# Patient Record
Sex: Female | Born: 1975
Health system: Southern US, Community
[De-identification: ages and names within clinical notes are randomized; demographics above are authoritative.]

## PROBLEM LIST (undated history)

## (undated) DIAGNOSIS — Z789 Other specified health status: Secondary | ICD-10-CM

## (undated) HISTORY — DX: Other specified health status: Z78.9

---

## 2015-11-10 HISTORY — PX: ABLATION: SHX5711

## 2018-01-21 ENCOUNTER — Ambulatory Visit: Payer: Self-pay | Admitting: Family Medicine

## 2018-04-22 ENCOUNTER — Encounter: Payer: Self-pay | Admitting: Family Medicine

## 2018-04-22 ENCOUNTER — Ambulatory Visit (INDEPENDENT_AMBULATORY_CARE_PROVIDER_SITE_OTHER): Payer: 59 | Admitting: Family Medicine

## 2018-04-22 VITALS — BP 112/70 | HR 85 | Temp 98.5°F | Resp 12 | Ht 65.0 in | Wt 122.2 lb

## 2018-04-22 DIAGNOSIS — Z1231 Encounter for screening mammogram for malignant neoplasm of breast: Secondary | ICD-10-CM | POA: Diagnosis not present

## 2018-04-22 DIAGNOSIS — Z87898 Personal history of other specified conditions: Secondary | ICD-10-CM | POA: Diagnosis not present

## 2018-04-22 DIAGNOSIS — F17200 Nicotine dependence, unspecified, uncomplicated: Secondary | ICD-10-CM

## 2018-04-22 DIAGNOSIS — Z1239 Encounter for other screening for malignant neoplasm of breast: Secondary | ICD-10-CM

## 2018-04-22 DIAGNOSIS — Z8742 Personal history of other diseases of the female genital tract: Secondary | ICD-10-CM | POA: Insufficient documentation

## 2018-04-22 DIAGNOSIS — D2272 Melanocytic nevi of left lower limb, including hip: Secondary | ICD-10-CM

## 2018-04-22 NOTE — Patient Instructions (Addendum)
A few things to remember from today's visit:   Melanocytic nevus of left lower extremity - Plan: Ambulatory referral to Dermatology  Tobacco use disorder  Hx of abnormal cervical Pap smear  Breast cancer screening - Plan: MM 3D SCREEN BREAST BILATERAL  Please schedule appointment for your complete physical/gynecologic care. Try Chantix for smoking cessation. Mammogram was ordered today.   Please be sure medication list is accurate. If a new problem present, please set up appointment sooner than planned today.

## 2018-04-22 NOTE — Progress Notes (Signed)
HPI:   [name] is a 42 y.o. female, who is here today to establish care.  Former PCP: N/A.  She moved from [place] almost 2 years ago and has not established with PCP. Last preventive routine visit: 2017.  Chronic medical problems: Abnormal Pap smear in 2017 and tobacco use disorder.  She has prescription for Chantix at home but she is afraid of having side effects, so she has not started it. She has tried other different pharmacologic treatments, none has helped. She denies history of depression or anxiety.    Concerns today: "Mole", which she noted initially about a year ago and seems to be growing. She has more on left great toe and left thigh.  She has not noted pain, easy bleeding, or pruritus. Family history for skin cancer negative. She is not consistent with sunscreen use.  She also would like to have a mammogram scheduled. Maternal grandmother with history of breast cancer in her early 20s.    Review of Systems  Constitutional: Negative for activity change, appetite change, fatigue, fever and unexpected weight change.  HENT: Negative for mouth sores, nosebleeds and trouble swallowing.   Eyes: Negative for redness and visual disturbance.  Respiratory: Negative for cough, shortness of breath and wheezing.   Cardiovascular: Negative for chest pain, palpitations and leg swelling.  Gastrointestinal: Negative for abdominal pain, nausea and vomiting.       Negative for changes in bowel habits.  Genitourinary: Negative for decreased urine volume and hematuria.  Skin: Negative for color change and wound.  Neurological: Negative for syncope, weakness and headaches.  Psychiatric/Behavioral: Negative for sleep disturbance. The patient is not nervous/anxious.       No current outpatient medications on file prior to visit.   No current facility-administered medications on file prior to visit.      History reviewed. No pertinent past medical  history. No Known Allergies  Family History  Problem Relation Age of Onset  . Cancer Father   . Cancer Maternal Grandmother     Social History   Socioeconomic History  . Marital status: Married    Spouse name: Not on file  . Number of children: 4  . Years of education: Not on file  . Highest education level: Not on file  Occupational History  . Not on file  Social Needs  . Financial resource strain: Not on file  . Food insecurity:    Worry: Not on file    Inability: Not on file  . Transportation needs:    Medical: Not on file    Non-medical: Not on file  Tobacco Use  . Smoking status: Current Every Day Smoker    Types: Cigarettes  . Smokeless tobacco: Never Used  Substance and Sexual Activity  . Alcohol use: Not Currently  . Drug use: Not Currently  . Sexual activity: Yes  Lifestyle  . Physical activity:    Days per week: Not on file    Minutes per session: Not on file  . Stress: Not on file  Relationships  . Social connections:    Talks on phone: Not on file    Gets together: Not on file    Attends religious service: Not on file    Active member of club or organization: Not on file    Attends meetings of clubs or organizations: Not on file    Relationship status: Not on file  Other Topics Concern  . Not on file  Social History  Narrative  . Not on file    Vitals:   04/22/18 1032  BP: 112/70  Pulse: 85  Resp: 12  Temp: 98.5 F (36.9 C)  SpO2: 99%    Body mass index is 20.34 kg/m.   Physical Exam  Nursing note and vitals reviewed. Constitutional: She is oriented to person, place, and time. She appears well-developed and well-nourished. No distress.  HENT:  Head: Normocephalic and atraumatic.  Mouth/Throat: Oropharynx is clear and moist and mucous membranes are normal.  Eyes: Pupils are equal, round, and reactive to light. Conjunctivae are normal.  Cardiovascular: Normal rate and regular rhythm.  No murmur heard. Pulses:      Dorsalis pedis  pulses are 2+ on the right side, and 2+ on the left side.  Respiratory: Effort normal and breath sounds normal. No respiratory distress.  GI: Soft. She exhibits no mass. There is no hepatomegaly. There is no tenderness.  Musculoskeletal: She exhibits no edema.       Feet:  Lymphadenopathy:    She has no cervical adenopathy.  Neurological: She is alert and oriented to person, place, and time. She has normal strength. Coordination normal.  Skin: Skin is warm. No rash noted. No erythema.  1-2 mm hyperpigmented macular lesion left great toe.Even pigmentation and defined borders. Seen MS foot graphic.  Psychiatric: She has a normal mood and affect.  Well groomed, good eye contact.      ASSESSMENT AND PLAN:  [name] was seen today for establish care.  Diagnoses and all orders for this visit:  Melanocytic nevus of left lower extremity  Lesion doe snot seem suspicious. Because it is growing as well as her concern, dermatology appt will be arranged. Sun screen and protective clothes recommended.  -     Ambulatory referral to Dermatology  Tobacco use disorder  Adverse effects of tobacco discussed as well as benefits of smoking cessation discussed. We reviewed some side effects of Chantix, encouraged to try.  Hx of abnormal cervical Pap smear  S/P colpo Bx, reported as negative. She would like to have pap smear done here,so she will arrange appt.  Breast cancer screening -     MM 3D SCREEN BREAST BILATERAL; Future      Jessica Rivenburg G. Martinique, MD  [facility]. [place] office.

## 2018-04-25 ENCOUNTER — Encounter: Payer: Self-pay | Admitting: Family Medicine

## 2018-04-25 ENCOUNTER — Ambulatory Visit (INDEPENDENT_AMBULATORY_CARE_PROVIDER_SITE_OTHER): Payer: 59 | Admitting: Family Medicine

## 2018-04-25 ENCOUNTER — Other Ambulatory Visit (HOSPITAL_COMMUNITY)
Admission: RE | Admit: 2018-04-25 | Discharge: 2018-04-25 | Disposition: A | Discharge status: Home / Self Care | Payer: 59 | Source: Ambulatory Visit | Attending: Family Medicine | Admitting: Family Medicine

## 2018-04-25 VITALS — BP 120/60 | HR 67 | Temp 97.6°F | Resp 12 | Ht 65.0 in | Wt 122.4 lb

## 2018-04-25 DIAGNOSIS — Z13220 Encounter for screening for lipoid disorders: Secondary | ICD-10-CM | POA: Diagnosis present

## 2018-04-25 DIAGNOSIS — Z23 Encounter for immunization: Secondary | ICD-10-CM

## 2018-04-25 DIAGNOSIS — Z131 Encounter for screening for diabetes mellitus: Secondary | ICD-10-CM

## 2018-04-25 DIAGNOSIS — Z124 Encounter for screening for malignant neoplasm of cervix: Secondary | ICD-10-CM

## 2018-04-25 DIAGNOSIS — Z0000 Encounter for general adult medical examination without abnormal findings: Secondary | ICD-10-CM | POA: Diagnosis not present

## 2018-04-25 LAB — BASIC METABOLIC PANEL
BUN: 13 mg/dL (ref 6–23)
CALCIUM: 9.3 mg/dL (ref 8.4–10.5)
CO2: 29 meq/L (ref 19–32)
CREATININE: 0.62 mg/dL (ref 0.40–1.20)
Chloride: 103 mEq/L (ref 96–112)
GFR: 113.34 mL/min (ref >60.00)
GLUCOSE: 87 mg/dL (ref 70–99)
Potassium: 4.5 mEq/L (ref 3.5–5.1)
Sodium: 138 mEq/L (ref 135–145)

## 2018-04-25 LAB — LIPID PANEL
CHOLESTEROL: 146 mg/dL (ref 0–200)
HDL: 61 mg/dL (ref >39.00)
LDL Cholesterol: 74 mg/dL (ref 0–99)
NONHDL: 85.45
Total CHOL/HDL Ratio: 2
Triglycerides: 56 mg/dL (ref 0.0–149.0)
VLDL: 11.2 mg/dL (ref 0.0–40.0)

## 2018-04-25 NOTE — Progress Notes (Signed)
HPI:   Jessica Singh is a 42 y.o. female, who is here today for her routine physical.  I saw her last on 04/22/18 to establish care. Last CPE: 2017  Regular exercise 3 or more time per week: Not consistently. Following a healthy diet: Yes She lives with her husband and 4 children.  Chronic medical problems: Tobacco use disorder.  Pap smear in 2017,reported as normal.  Hx of abnormal pap smears: She has had conization and LEEP. Last abnormal one not sure if over 10 years. Hx of STD's No   There is no immunization history on file for this patient.  Mammogram: N/A Colonoscopy: N/A DEXA: N/A  G:6 L:4 LMP 2017, S/p endometrial ablation.  S/P BTL.   Review of Systems  Constitutional: Negative for appetite change, fatigue, fever and unexpected weight change.  HENT: Negative for dental problem, hearing loss, nosebleeds, trouble swallowing and voice change.   Eyes: Negative for redness and visual disturbance.  Respiratory: Negative for cough, shortness of breath and wheezing.   Cardiovascular: Negative for chest pain and leg swelling.  Gastrointestinal: Negative for abdominal pain, blood in stool, nausea and vomiting.       No changes in bowel habits.  Endocrine: Negative for cold intolerance, heat intolerance, polydipsia, polyphagia and polyuria.  Genitourinary: Negative for decreased urine volume, dysuria, hematuria, menstrual problem, vaginal bleeding and vaginal discharge.       No breast tenderness or nipple discharge.  Musculoskeletal: Negative for arthralgias and back pain.  Skin: Negative for rash and wound.  Neurological: Negative for syncope, weakness, numbness and headaches.  Hematological: Negative for adenopathy. Does not bruise/bleed easily.  Psychiatric/Behavioral: Negative for confusion and sleep disturbance. The patient is not nervous/anxious.   All other systems reviewed and are negative.     No current outpatient medications on file  prior to visit.   No current facility-administered medications on file prior to visit.      History reviewed. No pertinent past medical history.  Past Surgical History:  Procedure Laterality Date  . ABLATION  2017  . CESAREAN SECTION  07/16/96, 11/19/11, 12/24/12    No Known Allergies  Family History  Problem Relation Age of Onset  . Cancer Father   . Cancer Maternal Grandmother     Social History   Socioeconomic History  . Marital status: Married    Spouse name: Not on file  . Number of children: 4  . Years of education: Not on file  . Highest education level: Not on file  Occupational History  . Not on file  Social Needs  . Financial resource strain: Not on file  . Food insecurity:    Worry: Not on file    Inability: Not on file  . Transportation needs:    Medical: Not on file    Non-medical: Not on file  Tobacco Use  . Smoking status: Current Every Day Smoker    Types: Cigarettes  . Smokeless tobacco: Never Used  Substance and Sexual Activity  . Alcohol use: Not Currently  . Drug use: Not Currently  . Sexual activity: Yes  Lifestyle  . Physical activity:    Days per week: Not on file    Minutes per session: Not on file  . Stress: Not on file  Relationships  . Social connections:    Talks on phone: Not on file    Gets together: Not on file    Attends religious service: Not on file    Active  member of club or organization: Not on file    Attends meetings of clubs or organizations: Not on file    Relationship status: Not on file  Other Topics Concern  . Not on file  Social History Narrative  . Not on file     Vitals:   04/25/18 0843  BP: 120/60  Pulse: 67  Resp: 12  Temp: 97.6 F (36.4 C)  SpO2: 97%   Body mass index is 20.36 kg/m.   Wt Readings from Last 3 Encounters:  04/25/18 122 lb 6 oz (55.5 kg)  04/22/18 122 lb 4 oz (55.5 kg)     Physical Exam  Nursing note and vitals reviewed. Constitutional: She is oriented to person, place,  and time. She appears well-developed and well-nourished. No distress.  HENT:  Head: Normocephalic and atraumatic.  Right Ear: Hearing, tympanic membrane, external ear and ear canal normal.  Left Ear: Hearing, tympanic membrane, external ear and ear canal normal.  Mouth/Throat: Uvula is midline, oropharynx is clear and moist and mucous membranes are normal.  Eyes: Pupils are equal, round, and reactive to light. Conjunctivae and EOM are normal.  Neck: No tracheal deviation present. No thyromegaly present.  Cardiovascular: Normal rate and regular rhythm.  No murmur heard. Pulses:      Dorsalis pedis pulses are 2+ on the right side, and 2+ on the left side.  Respiratory: Effort normal and breath sounds normal. No respiratory distress. No breast swelling or tenderness.  GI: Soft. She exhibits no mass. There is no hepatomegaly. There is no tenderness.  Genitourinary: There is no rash, tenderness or lesion on the right labia. There is no rash, tenderness or lesion on the left labia. Uterus is not enlarged and not tender. Cervix exhibits discharge. Cervix exhibits no motion tenderness and no friability. Right adnexum displays no mass, no tenderness and no fullness. Left adnexum displays no mass, no tenderness and no fullness. No erythema or bleeding in the vagina. Vaginal discharge found.  Genitourinary Comments: Breast: Fibrocystic changes outer quadrants,bilateral. No nipple discharge or skin abnormalities bilaterally.  Pap smear collected.  Musculoskeletal: She exhibits no edema.  No major deformity or sing of synovitis appreciated.  Lymphadenopathy:    She has no cervical adenopathy.       Right: No inguinal and no supraclavicular adenopathy present.       Left: No inguinal and no supraclavicular adenopathy present.  Neurological: She is alert and oriented to person, place, and time. She has normal strength. No cranial nerve deficit. Coordination and gait normal.  Reflex Scores:      Bicep  reflexes are 2+ on the right side and 2+ on the left side.      Patellar reflexes are 2+ on the right side and 2+ on the left side. Skin: Skin is warm. No rash noted. There is erythema.  Skin erythema on face and shoulders, sunburn.  Psychiatric: She has a normal mood and affect. Her speech is normal.  Well groomed, good eye contact.      ASSESSMENT AND PLAN:  Ms. Trameka Dorough was here today annual physical examination.   Orders Placed This Encounter  Procedures  . Lipid panel  . Basic metabolic panel    Lab Results  Component Value Date   CREATININE 0.62 04/25/2018   BUN 13 04/25/2018   NA 138 04/25/2018   K 4.5 04/25/2018   CL 103 04/25/2018   CO2 29 04/25/2018   Lab Results  Component Value Date   CHOL  146 04/25/2018   HDL 61.00 04/25/2018   LDLCALC 74 04/25/2018   TRIG 56.0 04/25/2018   CHOLHDL 2 04/25/2018    Routine general medical examination at a health care facility  We discussed the importance of regular physical activity and healthy diet for prevention of chronic illness and/or complications. Preventive guidelines reviewed. Vaccination updated. Mammogram pending.Pap smear collected. Smoking cessation encouraged. Strongly recommend avoidance of direct UV exposure, bid hat as well as sunscreen. Next CPE in a year.   Cervical cancer screening -     Cytology - PAP ([place])  Screening for lipid disorders -     Lipid panel  Diabetes mellitus screening -     Basic metabolic panel    Return in 1 year (on 04/26/2019) for CPE .      Tadeo Besecker G. Martinique, MD  [facility]. [place] office.

## 2018-04-25 NOTE — Patient Instructions (Addendum)
A few things to remember from today's visit:   Routine general medical examination at a health care facility  Cervical cancer screening - Plan: Cytology - PAP ([place])  Screening for lipid disorders - Plan: Lipid panel  Diabetes mellitus screening - Plan: Basic metabolic panel  Today you have you routine preventive visit.  At least 150 minutes of moderate exercise per week, daily brisk walking for 15-30 min is a good exercise option. Healthy diet low in saturated (animal) fats and sweets and consisting of fresh fruits and vegetables, lean meats such as fish and white chicken and whole grains.  These are some of recommendations for screening depending of age and risk factors:   - Vaccines:   Tdap vaccine every 10 years. Given today.  Shingles vaccine recommended at age 57, could be given after 40 years of age but not sure about insurance coverage.   Pneumonia vaccines:  Prevnar 13 at 65 and Pneumovax at 45. Sometimes Pneumovax is giving earlier if history of smoking, lung disease,diabetes,kidney disease among some.    Screening for diabetes at age 2 and every 3 years.  Cervical cancer prevention:  Pap smear starts at 40 years of age and continues periodically until 40 years old in low risk women. Pap smear every 3 years between 63 and 39 years old. Pap smear every 3-5 years between women 48 and older if pap smear negative and HPV screening negative.   -Breast cancer: Mammogram: There is disagreement between experts about when to start screening in low risk asymptomatic female but recent recommendations are to start screening at 73 and not later than 40 years old , every 1-2 years and after 40 yo q 2 years. Screening is recommended until 40 years old but some women can continue screening depending of healthy issues.   Colon cancer screening: starts at 40 years old until 40 years old.  Cholesterol disorder screening at age 59 and every 3 years.  Also  recommended:  1. Dental visit- Brush and floss your teeth twice daily; visit your dentist twice a year. 2. Eye doctor- Get an eye exam at least every 2 years. 3. Helmet use- Always wear a helmet when riding a bicycle, motorcycle, rollerblading or skateboarding. 4. Safe sex- If you may be exposed to sexually transmitted infections, use a condom. 5. Seat belts- Seat belts can save your live; always wear one. 6. Smoke/Carbon Monoxide detectors- These detectors need to be installed on the appropriate level of your home. Replace batteries at least once a year. 7. Skin cancer- When out in the sun please cover up and use sunscreen 15 SPF or higher. 8. Violence- If anyone is threatening or hurting you, please tell your healthcare provider.  9. Drink alcohol in moderation- Limit alcohol intake to one drink or less per day. Never drink and drive.  Please be sure medication list is accurate. If a new problem present, please set up appointment sooner than planned today.

## 2018-04-27 LAB — CYTOLOGY - PAP
DIAGNOSIS: NEGATIVE
HPV (WINDOPATH): NOT DETECTED

## 2018-06-06 ENCOUNTER — Ambulatory Visit
Admission: RE | Admit: 2018-06-06 | Discharge: 2018-06-06 | Disposition: A | Discharge status: Home / Self Care | Payer: 59 | Source: Ambulatory Visit | Attending: Family Medicine | Admitting: Family Medicine

## 2018-06-06 DIAGNOSIS — Z1231 Encounter for screening mammogram for malignant neoplasm of breast: Secondary | ICD-10-CM | POA: Diagnosis not present

## 2018-06-06 DIAGNOSIS — Z1239 Encounter for other screening for malignant neoplasm of breast: Secondary | ICD-10-CM

## 2018-06-20 ENCOUNTER — Telehealth: Payer: Self-pay | Admitting: Family Medicine

## 2018-06-20 NOTE — Telephone Encounter (Signed)
Spoke with patient and she that even though her imaging was normal, she wanted to know if she could get more testing done, maybe a sonogram because of the density in her breast and the fact that her grandmother had bilateral breast cancer and she had two aunts that passed away from breast cancer. Please advise.

## 2018-06-20 NOTE — Telephone Encounter (Signed)
Copied from [place] [id number]. Topic: Quick Communication - Other Results >> Jun 20, 2018 12:42 PM Hewitt Shorts wrote: Pt has been told her mammogram results but she has more questions as to what the next step needs to be   Best number     [phone]

## 2018-06-20 NOTE — Telephone Encounter (Signed)
Sonogram is usually performed when mammogram is abnormal and planning on performing guide biopsy.  Given the fact she had a completely normal mammogram, Bi-Rads 1, further imaging is not recommended nor appropriate at this time.  Dense breast alone is not an indication for sonogram or for repeating mammograms more often.  If she has further questions in the regard we can arrange appointment for further discussion. If she wants a second opinion she could arrange appointment with gynecologist.  Thanks, BJ

## 2018-06-21 NOTE — Telephone Encounter (Signed)
Spoke with patient, gave instructions per Dr. Jordan. Patient verbalized understanding. 

## 2018-07-21 DIAGNOSIS — L814 Other melanin hyperpigmentation: Secondary | ICD-10-CM | POA: Diagnosis not present

## 2018-07-21 DIAGNOSIS — D225 Melanocytic nevi of trunk: Secondary | ICD-10-CM | POA: Diagnosis not present

## 2018-07-21 DIAGNOSIS — D1801 Hemangioma of skin and subcutaneous tissue: Secondary | ICD-10-CM | POA: Diagnosis not present

## 2018-09-19 ENCOUNTER — Telehealth: Payer: Self-pay | Admitting: Family Medicine

## 2018-09-19 NOTE — Telephone Encounter (Signed)
Message sent to Dr. Jordan for review and approval. 

## 2018-09-19 NOTE — Telephone Encounter (Signed)
Copied from [place] [id number]. Topic: Quick Communication - See Telephone Encounter >> Sep 19, 2018 12:23 PM Antonieta Iba C wrote: CRM for notification. See Telephone encounter for: 09/19/18.  Pt is requesting a Rx for Mupirocin for impetigo. Pt says that she use to take medication for concern with her previous provider. Pt says that she is new to Dr. Martinique but it should be in her med records.    Pharmacy: [facility] [address] [place] - [place] [address] [phone] (Phone) [phone] (Fax)    CB: [phone]

## 2018-09-23 NOTE — Telephone Encounter (Signed)
She can use topical OTC antibiotic and follow if needed. Thanks, BJ

## 2018-09-23 NOTE — Telephone Encounter (Signed)
Patient given instructions per Dr. Martinique. Patient verbalized understanding.

## 2019-04-28 ENCOUNTER — Ambulatory Visit (INDEPENDENT_AMBULATORY_CARE_PROVIDER_SITE_OTHER): Payer: 59 | Admitting: Family Medicine

## 2019-04-28 ENCOUNTER — Other Ambulatory Visit: Payer: Self-pay

## 2019-04-28 ENCOUNTER — Encounter: Payer: Self-pay | Admitting: Family Medicine

## 2019-04-28 VITALS — BP 120/58 | HR 90 | Temp 97.6°F | Resp 12 | Ht 65.0 in | Wt 120.6 lb

## 2019-04-28 DIAGNOSIS — Z13220 Encounter for screening for lipoid disorders: Secondary | ICD-10-CM | POA: Diagnosis not present

## 2019-04-28 DIAGNOSIS — Z1329 Encounter for screening for other suspected endocrine disorder: Secondary | ICD-10-CM

## 2019-04-28 DIAGNOSIS — Z0000 Encounter for general adult medical examination without abnormal findings: Secondary | ICD-10-CM

## 2019-04-28 DIAGNOSIS — Z0189 Encounter for other specified special examinations: Secondary | ICD-10-CM | POA: Diagnosis not present

## 2019-04-28 DIAGNOSIS — Z13228 Encounter for screening for other metabolic disorders: Secondary | ICD-10-CM | POA: Diagnosis not present

## 2019-04-28 DIAGNOSIS — Z130 Encounter for screening for diseases of the blood and blood-forming organs and certain disorders involving the immune mechanism: Secondary | ICD-10-CM | POA: Diagnosis not present

## 2019-04-28 DIAGNOSIS — Z008 Encounter for other general examination: Secondary | ICD-10-CM

## 2019-04-28 DIAGNOSIS — N644 Mastodynia: Secondary | ICD-10-CM

## 2019-04-28 LAB — LIPID PANEL
Cholesterol: 138 mg/dL (ref 0–200)
HDL: 63.5 mg/dL (ref >39.00)
LDL Cholesterol: 64 mg/dL (ref 0–99)
NonHDL: 74.26
Total CHOL/HDL Ratio: 2
Triglycerides: 52 mg/dL (ref 0.0–149.0)
VLDL: 10.4 mg/dL (ref 0.0–40.0)

## 2019-04-28 LAB — BASIC METABOLIC PANEL
BUN: 13 mg/dL (ref 6–23)
CO2: 28 mEq/L (ref 19–32)
Calcium: 9.3 mg/dL (ref 8.4–10.5)
Chloride: 103 mEq/L (ref 96–112)
Creatinine, Ser: 0.59 mg/dL (ref 0.40–1.20)
GFR: 112.35 mL/min (ref >60.00)
Glucose, Bld: 78 mg/dL (ref 70–99)
Potassium: 4.2 mEq/L (ref 3.5–5.1)
Sodium: 138 mEq/L (ref 135–145)

## 2019-04-28 NOTE — Patient Instructions (Signed)
A few things to remember from today's visit:   Routine general medical examination at a health care facility  Screening for lipoid disorders - Plan: Lipid panel  Screening for endocrine, metabolic and immunity disorder - Plan: Basic metabolic panel Today you have you routine preventive visit.  At least 150 minutes of moderate exercise per week, daily brisk walking for 15-30 min is a good exercise option. Healthy diet low in saturated (animal) fats and sweets and consisting of fresh fruits and vegetables, lean meats such as fish and white chicken and whole grains.  These are some of recommendations for screening depending of age and risk factors:   - Vaccines:  Tdap vaccine every 10 years.  Shingles vaccine recommended at age 83, could be given after 41 years of age but not sure about insurance coverage.   Pneumonia vaccines:  Prevnar 13 at 65 and Pneumovax at 50. Sometimes Pneumovax is giving earlier if history of smoking, lung disease,diabetes,kidney disease among some.    Screening for diabetes at age 43 and every 3 years.  Cervical cancer prevention:  Pap smear starts at 41 years of age and continues periodically until 41 years old in low risk women. Pap smear every 3 years between 27 and 73 years old. Pap smear every 3-5 years between women 74 and older if pap smear negative and HPV screening negative.   -Breast cancer: Mammogram: There is disagreement between experts about when to start screening in low risk asymptomatic female but recent recommendations are to start screening at 90 and not later than 41 years old , every 1-2 years and after 41 yo q 2 years. Screening is recommended until 41 years old but some women can continue screening depending of healthy issues.   Colon cancer screening: starts at 41 years old until 41 years old.  Cholesterol disorder screening at age 33 and every 3 years.  Also recommended:  1. Dental visit- Brush and floss your teeth twice  daily; visit your dentist twice a year. 2. Eye doctor- Get an eye exam at least every 2 years. 3. Helmet use- Always wear a helmet when riding a bicycle, motorcycle, rollerblading or skateboarding. 4. Safe sex- If you may be exposed to sexually transmitted infections, use a condom. 5. Seat belts- Seat belts can save your live; always wear one. 6. Smoke/Carbon Monoxide detectors- These detectors need to be installed on the appropriate level of your home. Replace batteries at least once a year. 7. Skin cancer- When out in the sun please cover up and use sunscreen 15 SPF or higher. 8. Violence- If anyone is threatening or hurting you, please tell your healthcare provider.  9. Drink alcohol in moderation- Limit alcohol intake to one drink or less per day. Never drink and drive. Please be sure medication list is accurate. If a new problem present, please set up appointment sooner than planned today.

## 2019-04-28 NOTE — Progress Notes (Signed)
HPI:  Chief Complaint  Patient presents with  . Annual Exam    Jessica Singh is a 43 y.o. female, who is here today for her routine CPE. Last CPE:04/25/18.  She lives with her husband and their 4 children. Regular exercise:She walks a few times per week. Healthful diet:Yes  + Smoker.  Immunization History  Administered Date(s) Administered  . Tdap 04/25/2018    Last Pap smear:04/25/2018 negative,HPV negative. History of abnormal Pap smear:None in the past 10 years. History of STDs:Negative.  LMP:She had endometrial ablation in 2017.  G:6 L:4 Birth control:BTL  Mammogram: 06/06/2018 Birads 1. According to pt,after mammogram she was told she needed "a different test" because dense breast. She has bilateral breast tenderness,no nipple discharge. She has tenderness around the time she is supposed to have menses but this time seems to be lasting more than usual.  She needs labs done for insurance purposes.  Lab Results  Component Value Date   CHOL 146 04/25/2018   HDL 61.00 04/25/2018   LDLCALC 74 04/25/2018   TRIG 56.0 04/25/2018   CHOLHDL 2 04/25/2018     Review of Systems  Constitutional: Negative for appetite change, fatigue and fever.  HENT: Negative for dental problem, hearing loss, mouth sores, sore throat, trouble swallowing and voice change.   Eyes: Negative for redness and visual disturbance.  Respiratory: Negative for cough, shortness of breath and wheezing.   Cardiovascular: Negative for chest pain and leg swelling.  Gastrointestinal: Negative for abdominal pain, nausea and vomiting.       No changes in bowel habits.  Endocrine: Negative for cold intolerance, heat intolerance, polydipsia, polyphagia and polyuria.  Genitourinary: Negative for decreased urine volume, dysuria, hematuria, vaginal bleeding and vaginal discharge.  Musculoskeletal: Negative for arthralgias, gait problem and myalgias.  Skin: Negative for color change and rash.   Allergic/Immunologic: Negative for environmental allergies.  Neurological: Negative for syncope, weakness and headaches.  Hematological: Negative for adenopathy. Does not bruise/bleed easily.  Psychiatric/Behavioral: Negative for confusion and sleep disturbance. The patient is not nervous/anxious.   All other systems reviewed and are negative.    No current outpatient medications on file prior to visit.   No current facility-administered medications on file prior to visit.     Past Medical History:  Diagnosis Date  . Medical history non-contributory     No Known Allergies  Social History   Socioeconomic History  . Marital status: Married    Spouse name: Not on file  . Number of children: 4  . Years of education: Not on file  . Highest education level: Not on file  Occupational History  . Not on file  Social Needs  . Financial resource strain: Not on file  . Food insecurity    Worry: Not on file    Inability: Not on file  . Transportation needs    Medical: Not on file    Non-medical: Not on file  Tobacco Use  . Smoking status: Current Every Day Smoker    Types: Cigarettes  . Smokeless tobacco: Never Used  Substance and Sexual Activity  . Alcohol use: Not Currently  . Drug use: Not Currently  . Sexual activity: Yes  Lifestyle  . Physical activity    Days per week: Not on file    Minutes per session: Not on file  . Stress: Not on file  Relationships  . Social Herbalist on phone: Not on file    Gets together: Not  on file    Attends religious service: Not on file    Active member of club or organization: Not on file    Attends meetings of clubs or organizations: Not on file    Relationship status: Not on file  Other Topics Concern  . Not on file  Social History Narrative  . Not on file    Today's Vitals   04/28/19 0855  BP: (!) 120/58  Pulse: 90  Resp: 12  Temp: 97.6 F (36.4 C)  TempSrc: Oral  SpO2: 99%  Weight: 120 lb 9.6 oz (54.7 kg)     Body mass index is 20.07 kg/m.   Physical Exam  Nursing note and vitals reviewed. Constitutional: She is oriented to person, place, and time. She appears well-developed and well-nourished. No distress.  HENT:  Head: Normocephalic and atraumatic.  Right Ear: Hearing, tympanic membrane, external ear and ear canal normal.  Left Ear: Hearing, tympanic membrane, external ear and ear canal normal.  Mouth/Throat: Uvula is midline, oropharynx is clear and moist and mucous membranes are normal.  Eyes: Pupils are equal, round, and reactive to light. Conjunctivae and EOM are normal.  Neck: No tracheal deviation present. No thyromegaly present.  Cardiovascular: Normal rate and regular rhythm.  No murmur heard. Pulses:      Dorsalis pedis pulses are 2+ on the right side, and 2+ on the left side.  Respiratory: Effort normal and breath sounds normal. No respiratory distress.  GI: Soft. She exhibits no mass. There is no hepatomegaly. There is no tenderness.  Genitourinary: Breast: No masses, skin abnormalities, or nipple discharge appreciated bilateral. Fibrocystic like changes, mainly outer upper quadrants bilateral.Mild tenderness upon palpation. Musculoskeletal:        General: No edema.     Comments: No major deformity or signs of synovitis appreciated.  Lymphadenopathy:    She has no cervical or axillary adenopathy.       Right: No supraclavicular adenopathy present.       Left: No supraclavicular adenopathy present.  Neurological: She is alert and oriented to person, place, and time. She has normal strength. No cranial nerve deficit. Coordination and gait normal.  Reflex Scores:      Bicep reflexes are 2+ on the right side and 2+ on the left side.      Patellar reflexes are 2+ on the right side and 2+ on the left side. Skin: Skin is warm. No rash noted. No erythema.  Psychiatric: She is anxious. Well groomed, good eye contact.    ASSESSMENT AND PLAN:  Jessica Singh was seen today for  annual exam.  Diagnoses and all orders for this visit: Lab Results  Component Value Date   CHOL 138 04/28/2019   HDL 63.50 04/28/2019   LDLCALC 64 04/28/2019   TRIG 52.0 04/28/2019   CHOLHDL 2 04/28/2019   Lab Results  Component Value Date   CREATININE 0.59 04/28/2019   BUN 13 04/28/2019   NA 138 04/28/2019   K 4.2 04/28/2019   CL 103 04/28/2019   CO2 28 04/28/2019     Routine general medical examination at a health care facility We discussed the importance of regular physical activity and healthy diet for prevention of chronic illness and/or complications. Preventive guidelines reviewed. Vaccination up to date.  Next CPE in a year.  Screening for lipoid disorders -     Lipid panel  Screening for endocrine, metabolic and immunity disorder -     Basic metabolic panel  Encounter for biometric screening -  Basic metabolic panel -     Lipid panel  Breast tenderness in female We discussed possible etiologies,most likely related to fibrocystic breast disease. Upset when we discussed guideline recommendations in regard to screening. She wants a breast US,explianed that this is not used as screening test. Dx mammogram will be arranged.  -     MM DIAG BREAST TOMO BILATERAL; Future    Return in about 1 year (around 04/27/2020) for CPE.    Betty Martinique, MD [facility]. [place] office.

## 2019-04-29 ENCOUNTER — Encounter: Payer: Self-pay | Admitting: Family Medicine

## 2019-05-03 ENCOUNTER — Other Ambulatory Visit: Payer: Self-pay | Admitting: Family Medicine

## 2019-05-03 DIAGNOSIS — N644 Mastodynia: Secondary | ICD-10-CM

## 2019-06-08 ENCOUNTER — Ambulatory Visit
Admission: RE | Admit: 2019-06-08 | Discharge: 2019-06-08 | Disposition: A | Discharge status: Home / Self Care | Payer: 59 | Source: Ambulatory Visit | Attending: Family Medicine | Admitting: Family Medicine

## 2019-06-08 ENCOUNTER — Ambulatory Visit: Payer: 59

## 2019-06-08 ENCOUNTER — Other Ambulatory Visit: Payer: Self-pay

## 2019-06-08 ENCOUNTER — Other Ambulatory Visit: Payer: Self-pay | Admitting: Family Medicine

## 2019-06-08 DIAGNOSIS — Z1231 Encounter for screening mammogram for malignant neoplasm of breast: Secondary | ICD-10-CM

## 2019-06-08 DIAGNOSIS — N644 Mastodynia: Secondary | ICD-10-CM

## 2020-02-29 ENCOUNTER — Telehealth: Payer: Self-pay | Admitting: Family Medicine

## 2020-02-29 NOTE — Telephone Encounter (Signed)
Pt is requesting prescription for a cream for impetigo.

## 2020-03-01 NOTE — Telephone Encounter (Signed)
Tried calling patient. Unable to leave message

## 2020-03-01 NOTE — Telephone Encounter (Signed)
If she has "impetigo" she needs to be evaluated in the office. Thanks, BJ

## 2020-03-01 NOTE — Telephone Encounter (Signed)
Please advise 

## 2020-03-04 NOTE — Telephone Encounter (Signed)
Spoke with patient and she stated that it was almost gone, declined office visit.

## 2020-04-26 ENCOUNTER — Other Ambulatory Visit: Payer: Self-pay

## 2020-04-28 NOTE — Progress Notes (Signed)
HPI: [name] is a 44 y.o. female, who is here today for her routine physical.  Last CPE: 04/28/19  Regular exercise 3 or more time per week: Yes, walks 1-2 hours daily. Following a healthful diet: Yes. She lives with her husband and 4 children.  Chronic medical problems: Tobacco use disorder. She is planning on trying Nicotine lozenges.  Pap smear: 04/25/18 negative for malignancy and for HPV. Hx of abnormal pap smears: None in the past 10 years.  Hx of STD's Negative. G:6 L:4. S/P BTL. LMP: S/P endometrial ablation in 2017.  Immunization History  Administered Date(s) Administered  . Tdap 04/25/2018   Mammogram: 06/08/19 Birads 1. Colonoscopy: N/A DEXA: N/A  Hep C screening: Never.  She has no concerns today. She would like to have labs today,including CBC for insurance purposes.  Review of Systems  Constitutional: Negative for appetite change and fever.  HENT: Negative for dental problem, hearing loss, mouth sores and sore throat.   Eyes: Negative for redness and visual disturbance.  Respiratory: Negative for cough, shortness of breath and wheezing.   Cardiovascular: Negative for chest pain, palpitations and leg swelling.  Gastrointestinal: Negative for abdominal pain, nausea and vomiting.       No changes in bowel habits.  Endocrine: Negative for cold intolerance, heat intolerance, polydipsia, polyphagia and polyuria.  Genitourinary: Negative for decreased urine volume, dysuria, hematuria, vaginal bleeding and vaginal discharge.  Musculoskeletal: Negative for gait problem and myalgias.  Skin: Negative for color change and rash.  Allergic/Immunologic: Negative for environmental allergies.  Neurological: Negative for syncope, weakness and headaches.  Hematological: Negative for adenopathy. Does not bruise/bleed easily.  Psychiatric/Behavioral: Negative for confusion and sleep disturbance. The patient is nervous/anxious.   All other systems  reviewed and are negative.  Current Outpatient Medications on File Prior to Visit  Medication Sig Dispense Refill  . ampicillin (PRINCIPEN) 500 MG capsule TAKE CAPSULE BY MOUTH ONCE DAILY WITH FOOD     No current facility-administered medications on file prior to visit.    Past Medical History:  Diagnosis Date  . Medical history non-contributory     Past Surgical History:  Procedure Laterality Date  . ABLATION  2017  . CESAREAN SECTION  07/16/96, 11/19/11, 12/24/12    No Known Allergies  Family History  Problem Relation Age of Onset  . Cancer Father   . Cancer Maternal Grandmother   . Breast cancer Maternal Grandmother 79    Social History   Socioeconomic History  . Marital status: Married    Spouse name: Not on file  . Number of children: 4  . Years of education: Not on file  . Highest education level: Not on file  Occupational History  . Not on file  Tobacco Use  . Smoking status: Current Every Day Smoker    Types: Cigarettes  . Smokeless tobacco: Never Used  Vaping Use  . Vaping Use: Never used  Substance and Sexual Activity  . Alcohol use: Not Currently  . Drug use: Not Currently  . Sexual activity: Yes  Other Topics Concern  . Not on file  Social History Narrative  . Not on file   Social Determinants of Health   Financial Resource Strain:   . Difficulty of Paying Living Expenses:   Food Insecurity:   . Worried About Charity fundraiser in the Last Year:   . Arboriculturist in the Last Year:   Transportation Needs:   . Lack of Transportation (  Medical):   Marland Kitchen Lack of Transportation (Non-Medical):   Physical Activity:   . Days of Exercise per Week:   . Minutes of Exercise per Session:   Stress:   . Feeling of Stress :   Social Connections:   . Frequency of Communication with Friends and Family:   . Frequency of Social Gatherings with Friends and Family:   . Attends Religious Services:   . Active Member of Clubs or Organizations:   . Attends English as a second language teacher Meetings:   Marland Kitchen Marital Status:    Vitals:   04/29/20 0755  BP: 110/60  Pulse: 81  Resp: 12  Temp: 98.3 F (36.8 C)  SpO2: 99%   Body mass index is 19.4 kg/m.  Wt Readings from Last 3 Encounters:  04/29/20 120 lb 3.2 oz (54.5 kg)  04/28/19 120 lb 9.6 oz (54.7 kg)  04/25/18 122 lb 6 oz (55.5 kg)   Physical Exam  Nursing note and vitals reviewed. Constitutional: She is oriented to person, place, and time. She appears well-developed. No distress.  HENT:  Head: Normocephalic and atraumatic.  Right Ear: Hearing, tympanic membrane, external ear and ear canal normal.  Left Ear: Hearing, tympanic membrane, external ear and ear canal normal.  Mouth/Throat: Uvula is midline. Mucous membranes are moist. Oropharynx is clear.  Eyes: Pupils are equal, round, and reactive to light. Conjunctivae are normal.  Neck: No tracheal deviation present. No thyromegaly present.  Cardiovascular: Normal rate and regular rhythm.  No murmur heard. Pulses:      Dorsalis pedis pulses are 2+ on the right side and 2+ on the left side.  Respiratory: Effort normal and breath sounds normal. No respiratory distress.  GI: Soft. Normal appearance. She exhibits no mass. There is no hepatomegaly. There is no abdominal tenderness.  Genitourinary:    Genitourinary Comments: Breast: Fibrocystic like changes outer upper quadrants, L>R.No masses,skin changes,or nipple discharge.   Musculoskeletal:     Comments: No major deformity or signs of synovitis appreciated.  Lymphadenopathy:    She has no cervical adenopathy.       Right: No supraclavicular adenopathy present.       Left: No supraclavicular adenopathy present.  Neurological: She is alert and oriented to person, place, and time. No cranial nerve deficit. Coordination and gait normal.  Reflex Scores:      Bicep reflexes are 2+ on the right side and 2+ on the left side.      Patellar reflexes are 2+ on the right side and 2+ on the left side. Skin:  Skin is warm. No rash noted. No erythema.  Psychiatric: Her speech is normal. Affect normal. Her mood appears anxious.  Well groomed, good eye contact.   ASSESSMENT AND PLAN:  Ms. Jessica Singh was here today annual physical examination.  Orders Placed This Encounter  Procedures  . CBC with Differential  . Lipid panel  . Hepatitis C antibody screen  . Comprehensive metabolic panel   Lab Results  Component Value Date   CREATININE 0.60 04/29/2020   BUN 10 04/29/2020   NA 137 04/29/2020   K 3.9 04/29/2020   CL 102 04/29/2020   CO2 30 04/29/2020   Lab Results  Component Value Date   ALT 14 04/29/2020   AST 16 04/29/2020   ALKPHOS 45 04/29/2020   BILITOT 1.0 04/29/2020   Lab Results  Component Value Date   WBC 5.4 04/29/2020   HGB 14.1 04/29/2020   HCT 41.2 04/29/2020   MCV 93.0 04/29/2020  PLT 240.0 04/29/2020   Lab Results  Component Value Date   CHOL 154 04/29/2020   HDL 63.30 04/29/2020   LDLCALC 77 04/29/2020   TRIG 68.0 04/29/2020   CHOLHDL 2 04/29/2020   Routine general medical examination at a health care facility We discussed the importance of regular physical activity and healthy diet for prevention of chronic illness and/or complications. Preventive guidelines reviewed. Mammogram appt already arranged. Pap smear in 2024. Vaccination up to date. Smoking cessation strongly recommended, planning on trying nicotine lozenges. Next CPE in a year.  Encounter for HCV screening test for low risk patient -     Hepatitis C antibody screen  Screening for deficiency anemia -     CBC with Differential  Screening for lipoid disorders -     Lipid panel  Screening for endocrine, metabolic and immunity disorder -     Comprehensive metabolic panel   Return in 1 year (on 04/29/2021) for cpe.   [place] Freyre G. Martinique, MD  [facility]. [place] office.   Health Maintenance Due  Topic Date Due  . Hepatitis C Screening  Never done  . COVID-19  Vaccine (1) Never done  . HIV Screening  Never done    Depression screen [facility] 2/9 04/29/2020 04/25/2018  Decreased Interest 0 0  Down, Depressed, Hopeless 0 0  PHQ - 2 Score 0 0   Today you have you routine preventive visit.  At least 150 minutes of moderate exercise per week, daily brisk walking for 15-30 min is a good exercise option. Healthy diet low in saturated (animal) fats and sweets and consisting of fresh fruits and vegetables, lean meats such as fish and white chicken and whole grains.  These are some of recommendations for screening depending of age and risk factors:  - Vaccines:  Tdap vaccine every 10 years.  Shingles vaccine recommended at age 30, could be given after 42 years of age but not sure about insurance coverage.   Pneumonia vaccines: Pneumovax at 63. Sometimes Pneumovax is giving earlier if history of smoking, lung disease,diabetes,kidney disease among some.  Screening for diabetes at age 43 and every 3 years.  Cervical cancer prevention:  Pap smear starts at 42 years of age and continues periodically until 42 years old in low risk women. Pap smear every 3 years between 71 and 65 years old. Pap smear every 3-5 years between women 20 and older if pap smear negative and HPV screening negative.  -Breast cancer: Mammogram annually.  Colon cancer screening: Has been recently changed to 42 yo. Insurance may not cover until you are 42 years old. Screening is recommended until 42 years old.  Cholesterol disorder screening at age 50 and every 3 years.  Also recommended:  1. Dental visit- Brush and floss your teeth twice daily; visit your dentist twice a year. 2. Eye doctor- Get an eye exam at least every 2 years. 3. Helmet use- Always wear a helmet when riding a bicycle, motorcycle, rollerblading or skateboarding. 4. Safe sex- If you may be exposed to sexually transmitted infections, use a condom. 5. Seat belts- Seat belts can save your live; always wear  one. 6. Smoke/Carbon Monoxide detectors- These detectors need to be installed on the appropriate level of your home. Replace batteries at least once a year. 7. Skin cancer- When out in the sun please cover up and use sunscreen 15 SPF or higher. 8. Violence- If anyone is threatening or hurting you, please tell your healthcare provider.  9. Drink alcohol  in moderation- Limit alcohol intake to one drink or less per day. Never drink and drive. 10. Calcium supplementation 1000 to 1200 mg daily, ideally through your diet.  Vitamin D supplementation 800 units daily.

## 2020-04-29 ENCOUNTER — Other Ambulatory Visit: Payer: Self-pay | Admitting: Family Medicine

## 2020-04-29 ENCOUNTER — Other Ambulatory Visit: Payer: Self-pay

## 2020-04-29 ENCOUNTER — Encounter: Payer: Self-pay | Admitting: Family Medicine

## 2020-04-29 ENCOUNTER — Ambulatory Visit (INDEPENDENT_AMBULATORY_CARE_PROVIDER_SITE_OTHER): Payer: 59 | Admitting: Family Medicine

## 2020-04-29 VITALS — BP 110/60 | HR 81 | Temp 98.3°F | Resp 12 | Ht 66.0 in | Wt 120.2 lb

## 2020-04-29 DIAGNOSIS — Z0000 Encounter for general adult medical examination without abnormal findings: Secondary | ICD-10-CM | POA: Diagnosis not present

## 2020-04-29 DIAGNOSIS — Z1329 Encounter for screening for other suspected endocrine disorder: Secondary | ICD-10-CM | POA: Diagnosis not present

## 2020-04-29 DIAGNOSIS — Z130 Encounter for screening for diseases of the blood and blood-forming organs and certain disorders involving the immune mechanism: Secondary | ICD-10-CM

## 2020-04-29 DIAGNOSIS — Z1159 Encounter for screening for other viral diseases: Secondary | ICD-10-CM

## 2020-04-29 DIAGNOSIS — Z1231 Encounter for screening mammogram for malignant neoplasm of breast: Secondary | ICD-10-CM

## 2020-04-29 DIAGNOSIS — Z13228 Encounter for screening for other metabolic disorders: Secondary | ICD-10-CM

## 2020-04-29 DIAGNOSIS — Z13220 Encounter for screening for lipoid disorders: Secondary | ICD-10-CM | POA: Diagnosis not present

## 2020-04-29 LAB — COMPREHENSIVE METABOLIC PANEL
ALT: 14 U/L (ref 0–35)
AST: 16 U/L (ref 0–37)
Albumin: 4.5 g/dL (ref 3.5–5.2)
Alkaline Phosphatase: 45 U/L (ref 39–117)
BUN: 10 mg/dL (ref 6–23)
CO2: 30 mEq/L (ref 19–32)
Calcium: 9.2 mg/dL (ref 8.4–10.5)
Chloride: 102 mEq/L (ref 96–112)
Creatinine, Ser: 0.6 mg/dL (ref 0.40–1.20)
GFR: 109.65 mL/min (ref >60.00)
Glucose, Bld: 85 mg/dL (ref 70–99)
Potassium: 3.9 mEq/L (ref 3.5–5.1)
Sodium: 137 mEq/L (ref 135–145)
Total Bilirubin: 1 mg/dL (ref 0.2–1.2)
Total Protein: 6.8 g/dL (ref 6.0–8.3)

## 2020-04-29 LAB — LIPID PANEL
Cholesterol: 154 mg/dL (ref 0–200)
HDL: 63.3 mg/dL (ref >39.00)
LDL Cholesterol: 77 mg/dL (ref 0–99)
NonHDL: 90.37
Total CHOL/HDL Ratio: 2
Triglycerides: 68 mg/dL (ref 0.0–149.0)
VLDL: 13.6 mg/dL (ref 0.0–40.0)

## 2020-04-29 LAB — CBC WITH DIFFERENTIAL/PLATELET
Basophils Absolute: 0 10*3/uL (ref 0.0–0.1)
Basophils Relative: 0.7 % (ref 0.0–3.0)
Eosinophils Absolute: 0.1 10*3/uL (ref 0.0–0.7)
Eosinophils Relative: 1.6 % (ref 0.0–5.0)
HCT: 41.2 % (ref 36.0–46.0)
Hemoglobin: 14.1 g/dL (ref 12.0–15.0)
Lymphocytes Relative: 24.7 % (ref 12.0–46.0)
Lymphs Abs: 1.3 10*3/uL (ref 0.7–4.0)
MCHC: 34.2 g/dL (ref 30.0–36.0)
MCV: 93 fl (ref 78.0–100.0)
Monocytes Absolute: 0.4 10*3/uL (ref 0.1–1.0)
Monocytes Relative: 7.3 % (ref 3.0–12.0)
Neutro Abs: 3.5 10*3/uL (ref 1.4–7.7)
Neutrophils Relative %: 65.7 % (ref 43.0–77.0)
Platelets: 240 10*3/uL (ref 150.0–400.0)
RBC: 4.43 Mil/uL (ref 3.87–5.11)
RDW: 14.1 % (ref 11.5–15.5)
WBC: 5.4 10*3/uL (ref 4.0–10.5)

## 2020-04-29 NOTE — Patient Instructions (Addendum)
Health Maintenance Due  Topic Date Due  . Hepatitis C Screening  Never done  . COVID-19 Vaccine (1) Never done  . HIV Screening  Never done    Depression screen [facility] 2/9 04/29/2020 04/25/2018  Decreased Interest 0 0  Down, Depressed, Hopeless 0 0  PHQ - 2 Score 0 0   Today you have you routine preventive visit.  At least 150 minutes of moderate exercise per week, daily brisk walking for 15-30 min is a good exercise option. Healthy diet low in saturated (animal) fats and sweets and consisting of fresh fruits and vegetables, lean meats such as fish and white chicken and whole grains.  These are some of recommendations for screening depending of age and risk factors:  - Vaccines:  Tdap vaccine every 10 years.  Shingles vaccine recommended at age 68, could be given after 42 years of age but not sure about insurance coverage.   Pneumonia vaccines: Pneumovax at 83. Sometimes Pneumovax is giving earlier if history of smoking, lung disease,diabetes,kidney disease among some.  Screening for diabetes at age 15 and every 3 years.  Cervical cancer prevention:  Pap smear starts at 42 years of age and continues periodically until 42 years old in low risk women. Pap smear every 3 years between 40 and 48 years old. Pap smear every 3-5 years between women 56 and older if pap smear negative and HPV screening negative.  -Breast cancer: Mammogram annually.  Colon cancer screening: Has been recently changed to 42 yo. Insurance may not cover until you are 42 years old. Screening is recommended until 42 years old.  Cholesterol disorder screening at age 62 and every 3 years.  Also recommended:  1. Dental visit- Brush and floss your teeth twice daily; visit your dentist twice a year. 2. Eye doctor- Get an eye exam at least every 2 years. 3. Helmet use- Always wear a helmet when riding a bicycle, motorcycle, rollerblading or skateboarding. 4. Safe sex- If you may be exposed to sexually transmitted  infections, use a condom. 5. Seat belts- Seat belts can save your live; always wear one. 6. Smoke/Carbon Monoxide detectors- These detectors need to be installed on the appropriate level of your home. Replace batteries at least once a year. 7. Skin cancer- When out in the sun please cover up and use sunscreen 15 SPF or higher. 8. Violence- If anyone is threatening or hurting you, please tell your healthcare provider.  9. Drink alcohol in moderation- Limit alcohol intake to one drink or less per day. Never drink and drive. 10. Calcium supplementation 1000 to 1200 mg daily, ideally through your diet.  Vitamin D supplementation 800 units daily.

## 2020-04-30 LAB — HEPATITIS C ANTIBODY
Hepatitis C Ab: NONREACTIVE
SIGNAL TO CUT-OFF: 0.02 (ref <1.00)

## 2020-06-10 ENCOUNTER — Other Ambulatory Visit: Payer: Self-pay

## 2020-06-10 ENCOUNTER — Ambulatory Visit
Admission: RE | Admit: 2020-06-10 | Discharge: 2020-06-10 | Disposition: A | Discharge status: Home / Self Care | Payer: 59 | Source: Ambulatory Visit | Attending: Family Medicine | Admitting: Family Medicine

## 2020-06-10 DIAGNOSIS — Z1231 Encounter for screening mammogram for malignant neoplasm of breast: Secondary | ICD-10-CM

## 2020-11-14 ENCOUNTER — Telehealth: Payer: Self-pay | Admitting: *Deleted

## 2020-11-14 NOTE — Telephone Encounter (Signed)
Returned call from 4:18 PM. Would like to schedule with Dr. Marice Potter from a referral she received from a friend. Office told patient that Dr. Marice Potter retired from [facility]. Stated that she will call back if needed, she wants to get a referral for another doctor.

## 2021-05-05 ENCOUNTER — Encounter: Payer: 59 | Admitting: Family Medicine

## 2021-05-09 ENCOUNTER — Other Ambulatory Visit: Payer: Self-pay | Admitting: Family Medicine

## 2021-05-09 DIAGNOSIS — Z1231 Encounter for screening mammogram for malignant neoplasm of breast: Secondary | ICD-10-CM

## 2021-05-19 ENCOUNTER — Encounter: Payer: Self-pay | Admitting: Family Medicine

## 2021-05-19 ENCOUNTER — Ambulatory Visit (INDEPENDENT_AMBULATORY_CARE_PROVIDER_SITE_OTHER): Payer: 59 | Admitting: Family Medicine

## 2021-05-19 ENCOUNTER — Ambulatory Visit (INDEPENDENT_AMBULATORY_CARE_PROVIDER_SITE_OTHER)
Admission: RE | Admit: 2021-05-19 | Discharge: 2021-05-19 | Disposition: A | Discharge status: Home / Self Care | Payer: 59 | Source: Ambulatory Visit | Attending: Family Medicine | Admitting: Family Medicine

## 2021-05-19 ENCOUNTER — Other Ambulatory Visit: Payer: Self-pay

## 2021-05-19 VITALS — BP 118/70 | HR 81 | Temp 98.1°F | Resp 12 | Ht 66.0 in | Wt 125.5 lb

## 2021-05-19 DIAGNOSIS — M545 Low back pain, unspecified: Secondary | ICD-10-CM | POA: Diagnosis not present

## 2021-05-19 DIAGNOSIS — Z13220 Encounter for screening for lipoid disorders: Secondary | ICD-10-CM | POA: Diagnosis not present

## 2021-05-19 DIAGNOSIS — Z0000 Encounter for general adult medical examination without abnormal findings: Secondary | ICD-10-CM | POA: Diagnosis not present

## 2021-05-19 DIAGNOSIS — Z130 Encounter for screening for diseases of the blood and blood-forming organs and certain disorders involving the immune mechanism: Secondary | ICD-10-CM | POA: Diagnosis not present

## 2021-05-19 DIAGNOSIS — Z1329 Encounter for screening for other suspected endocrine disorder: Secondary | ICD-10-CM | POA: Diagnosis not present

## 2021-05-19 DIAGNOSIS — Z13228 Encounter for screening for other metabolic disorders: Secondary | ICD-10-CM | POA: Diagnosis not present

## 2021-05-19 LAB — CBC
HCT: 40.6 % (ref 36.0–46.0)
Hemoglobin: 14 g/dL (ref 12.0–15.0)
MCHC: 34.4 g/dL (ref 30.0–36.0)
MCV: 89.6 fl (ref 78.0–100.0)
Platelets: 233 10*3/uL (ref 150.0–400.0)
RBC: 4.53 Mil/uL (ref 3.87–5.11)
RDW: 14.5 % (ref 11.5–15.5)
WBC: 6.2 10*3/uL (ref 4.0–10.5)

## 2021-05-19 LAB — BASIC METABOLIC PANEL
BUN: 13 mg/dL (ref 6–23)
CO2: 28 mEq/L (ref 19–32)
Calcium: 9.2 mg/dL (ref 8.4–10.5)
Chloride: 103 mEq/L (ref 96–112)
Creatinine, Ser: 0.64 mg/dL (ref 0.40–1.20)
GFR: 108.55 mL/min (ref >60.00)
Glucose, Bld: 87 mg/dL (ref 70–99)
Potassium: 4.3 mEq/L (ref 3.5–5.1)
Sodium: 138 mEq/L (ref 135–145)

## 2021-05-19 LAB — LIPID PANEL
Cholesterol: 145 mg/dL (ref 0–200)
HDL: 61 mg/dL (ref >39.00)
LDL Cholesterol: 71 mg/dL (ref 0–99)
NonHDL: 83.59
Total CHOL/HDL Ratio: 2
Triglycerides: 61 mg/dL (ref 0.0–149.0)
VLDL: 12.2 mg/dL (ref 0.0–40.0)

## 2021-05-19 NOTE — Progress Notes (Signed)
HPI: [name] is a 45 y.o. female, who is here today for her routine physical.  Last CPE: 04/29/20. Regular exercise 3 or more time per week: Walking about 1-3 miles per day. Following a healthy diet: Yes, mainly chicken, not a lot of vegetables but she eats salads daily. She cooks at home. She lives with her husband and 2 of her 4 children.  Chronic medical problems: Otherwise healthy except for tobacco use disorder.  Pap smear: 04/25/18 negative for malignancy and for HPV. Hx of abnormal pap smears: None in the past 10 years.   Hx of STD's Negative. G:6 L:4. S/P BTL. LMP: S/P endometrial ablation in 2017. She is trying to establish with gyn.  Immunization History  Administered Date(s) Administered   Tdap 04/25/2018   Mammogram: 06/10/20 Bi-Rads 1. Colonoscopy: N/A DEXA: N/A Hep C screening: 04/29/20 NR  She has some concerns today. She would like to have CBC done. Her father with hx of Hodgkin lymphoma. She has not noted fever,abnormal wt loss, night sweats, or enlarged glands.  A month os right lower back pain, achy like pain, mild. It radiated once to lateral aspect of right thigh and iliac crest. No skin changes, she feels like it is swollen.Afraid of this being an enlarged lymph node. Negative for saddle anesthesia and bowel/bladder dysfunction.  Review of Systems  Constitutional:  Positive for fatigue (No more than usual). Negative for appetite change.  HENT:  Negative for hearing loss, mouth sores, sore throat, trouble swallowing and voice change.   Eyes:  Negative for photophobia and visual disturbance.  Respiratory:  Negative for cough, shortness of breath and wheezing.   Cardiovascular:  Negative for chest pain, palpitations and leg swelling.  Gastrointestinal:  Negative for abdominal pain, nausea and vomiting.       No changes in bowel habits.  Endocrine: Negative for cold intolerance, heat intolerance, polydipsia, polyphagia and polyuria.   Genitourinary:  Negative for decreased urine volume, dysuria, hematuria, vaginal bleeding and vaginal discharge.  Musculoskeletal:  Positive for back pain. Negative for gait problem and myalgias.  Skin:  Negative for color change and rash.  Allergic/Immunologic: Negative for environmental allergies.  Neurological:  Negative for syncope, weakness and headaches.  Hematological:  Negative for adenopathy. Does not bruise/bleed easily.  Psychiatric/Behavioral:  Negative for confusion and sleep disturbance. The patient is nervous/anxious.   All other systems reviewed and are negative.  No current outpatient medications on file prior to visit.   No current facility-administered medications on file prior to visit.   Past Medical History:  Diagnosis Date   Medical history non-contributory     Past Surgical History:  Procedure Laterality Date   ABLATION  2017   CESAREAN SECTION  07/16/96, 11/19/11, 12/24/12   No Known Allergies  Family History  Problem Relation Age of Onset   Cancer Father    Cancer Maternal Grandmother    Breast cancer Maternal Grandmother 34   Social History   Socioeconomic History   Marital status: Married    Spouse name: Not on file   Number of children: 4   Years of education: Not on file   Highest education level: Not on file  Occupational History   Not on file  Tobacco Use   Smoking status: Every Day    Types: Cigarettes   Smokeless tobacco: Never  Vaping Use   Vaping Use: Never used  Substance and Sexual Activity   Alcohol use: Not Currently   Drug use: Not Currently  Sexual activity: Yes  Other Topics Concern   Not on file  Social History Narrative   Not on file   Social Determinants of Health   Financial Resource Strain: Not on file  Food Insecurity: Not on file  Transportation Needs: Not on file  Physical Activity: Not on file  Stress: Not on file  Social Connections: Not on file   Vitals:   05/19/21 0748  BP: 118/70  Pulse: 81   Resp: 12  Temp: 98.1 F (36.7 C)  SpO2: 99%   Body mass index is 20.26 kg/m.  Wt Readings from Last 3 Encounters:  05/19/21 125 lb 8 oz (56.9 kg)  04/29/20 120 lb 3.2 oz (54.5 kg)  04/28/19 120 lb 9.6 oz (54.7 kg)   Physical Exam Vitals and nursing note reviewed.  Constitutional:      General: She is not in acute distress.    Appearance: She is well-developed and normal weight.  HENT:     Head: Normocephalic and atraumatic.     Right Ear: Hearing, tympanic membrane, ear canal and external ear normal.     Left Ear: Hearing, tympanic membrane, ear canal and external ear normal.     Mouth/Throat:     Mouth: Mucous membranes are moist.     Pharynx: Oropharynx is clear. Uvula midline.  Eyes:     Extraocular Movements: Extraocular movements intact.     Conjunctiva/sclera: Conjunctivae normal.     Pupils: Pupils are equal, round, and reactive to light.  Neck:     Thyroid: No thyromegaly.     Trachea: No tracheal deviation.  Cardiovascular:     Rate and Rhythm: Normal rate and regular rhythm.     Pulses:          Dorsalis pedis pulses are 2+ on the right side and 2+ on the left side.     Heart sounds: No murmur heard. Pulmonary:     Effort: Pulmonary effort is normal. No respiratory distress.     Breath sounds: Normal breath sounds.  Chest:  Breasts:    Right: No supraclavicular adenopathy.     Left: No supraclavicular adenopathy.  Abdominal:     Palpations: Abdomen is soft. There is no hepatomegaly or mass.     Tenderness: There is no abdominal tenderness.  Genitourinary:    Comments: Deferred to gyn. Musculoskeletal:     Lumbar back: No tenderness or bony tenderness.       Back:     Comments: No major deformity or signs of synovitis appreciated.Upon palpation of right lower back noted a small nodular lesion, defined borders,mobile, about 2 cm, not tender.  Lymphadenopathy:     Cervical: No cervical adenopathy.     Upper Body:     Right upper body: No  supraclavicular adenopathy.     Left upper body: No supraclavicular adenopathy.  Skin:    General: Skin is warm.     Findings: No erythema or rash.  Neurological:     General: No focal deficit present.     Mental Status: She is alert and oriented to person, place, and time.     Cranial Nerves: No cranial nerve deficit.     Coordination: Coordination normal.     Gait: Gait normal.     Deep Tendon Reflexes:     Reflex Scores:      Bicep reflexes are 2+ on the right side and 2+ on the left side.      Patellar reflexes are 2+ on the  right side and 2+ on the left side. Psychiatric:        Mood and Affect: Mood is anxious.     Comments: Well groomed, good eye contact.   ASSESSMENT AND PLAN:  Ms. Jessica Singh was here today annual physical examination.  Orders Placed This Encounter  Procedures   DG Lumbar Spine Complete   Basic metabolic panel   CBC   Lipid panel   Lab Results  Component Value Date   CHOL 145 05/19/2021   HDL 61.00 05/19/2021   LDLCALC 71 05/19/2021   TRIG 61.0 05/19/2021   CHOLHDL 2 05/19/2021   Lab Results  Component Value Date   CREATININE 0.64 05/19/2021   BUN 13 05/19/2021   NA 138 05/19/2021   K 4.3 05/19/2021   CL 103 05/19/2021   CO2 28 05/19/2021   Lab Results  Component Value Date   WBC 6.2 05/19/2021   HGB 14.0 05/19/2021   HCT 40.6 05/19/2021   MCV 89.6 05/19/2021   PLT 233.0 05/19/2021   Routine general medical examination at a health care facility We discussed the importance of regular physical activity and healthy diet for prevention of chronic illness and/or complications. Preventive guidelines reviewed. Vaccination up to date. Encouraged smoking cessation. Continue female preventive care with gyn. Next CPE in a year.  The 10-year ASCVD risk score Mikey Bussing DC Brooke Bonito., et al., 2013) is: 1%   Values used to calculate the score:     Age: 46 years     Sex: Female     Is Non-Hispanic African American: No     Diabetic: No      Tobacco smoker: Yes     Systolic Blood Pressure: 259 mmHg     Is BP treated: No     HDL Cholesterol: 61 mg/dL     Total Cholesterol: 145 mg/dL  Acute right-sided low back pain without sciatica Hx and examination today does not suggest a serious process. Lumbar X ray ordered. Upon palpation small nodular lesion, I do not think this is causing symptoms, incidental finding, ? Lipoma. Instructed to continue monitoring for now, it is small and deep.  Screening for lipoid disorders -     Lipid panel  Screening for endocrine, metabolic and immunity disorder -     Basic metabolic panel   Return in 1 year (on 05/19/2022) for cpe..   Latanza Pfefferkorn G. Martinique, MD  [facility]. [place] office.

## 2021-05-19 NOTE — Patient Instructions (Addendum)
Today you have you routine preventive visit. A few things to remember from today's visit:  Routine general medical examination at a health care facility  Acute right-sided low back pain without sciatica - Plan: DG Lumbar Spine Complete, CBC  Screening for lipoid disorders - Plan: Lipid panel  Screening for endocrine, metabolic and immunity disorder - Plan: Basic metabolic panel  For back pain you can try Ibuprofen 200 mg 2 tabs with meals 3 times per day for 7-10 days. Little knot on area seems to be a lipoma, continue monitoring. Over the counter icy hot or asper cream on area may also help.  Do not use My Chart to request refills or for acute issues that need immediate attention.   Please be sure medication list is accurate. If a new problem present, please set up appointment sooner than planned today.  At least 150 minutes of moderate exercise per week, daily brisk walking for 15-30 min is a good exercise option. Healthy diet low in saturated (animal) fats and sweets and consisting of fresh fruits and vegetables, lean meats such as fish and white chicken and whole grains.  These are some of recommendations for screening depending of age and risk factors:  - Vaccines:  Tdap vaccine every 10 years.  Shingles vaccine recommended at age 73, could be given after 43 years of age but not sure about insurance coverage.   Pneumonia vaccines: Pneumovax at 34. Sometimes Pneumovax is giving earlier if history of smoking, lung disease,diabetes,kidney disease among some.  Screening for diabetes at age 1 and every 3 years.  Cervical cancer prevention:  Pap smear starts at 43 years of age and continues periodically until 43 years old in low risk women. Pap smear every 3 years between 69 and 16 years old. Pap smear every 3-5 years between women 71 and older if pap smear negative and HPV screening negative.   -Breast cancer: Mammogram: There is disagreement between experts about when to  start screening in low risk asymptomatic female but recent recommendations are to start screening at 58 and not later than 43 years old , every 1-2 years and after 43 yo q 2 years. Screening is recommended until 43 years old but some women can continue screening depending of healthy issues.  Colon cancer screening: Has been recently changed to 43 yo. Insurance may not cover until you are 43 years old. Screening is recommended until 43 years old.  Cholesterol disorder screening at age 60 and every 3 years.  Also recommended:  Dental visit- Brush and floss your teeth twice daily; visit your dentist twice a year. Eye doctor- Get an eye exam at least every 2 years. Helmet use- Always wear a helmet when riding a bicycle, motorcycle, rollerblading or skateboarding. Safe sex- If you may be exposed to sexually transmitted infections, use a condom. Seat belts- Seat belts can save your live; always wear one. Smoke/Carbon Monoxide detectors- These detectors need to be installed on the appropriate level of your home. Replace batteries at least once a year. Skin cancer- When out in the sun please cover up and use sunscreen 15 SPF or higher. Violence- If anyone is threatening or hurting you, please tell your healthcare provider.  Drink alcohol in moderation- Limit alcohol intake to one drink or less per day. Never drink and drive. Calcium supplementation 1000 to 1200 mg daily, ideally through your diet.  Vitamin D supplementation 800 units daily.

## 2021-05-20 ENCOUNTER — Telehealth: Payer: Self-pay | Admitting: Family Medicine

## 2021-05-20 NOTE — Telephone Encounter (Signed)
Patient called for X-Ray results that she had done 07/11 and she also wanted to know about a consultation for the lipoma.  Please advise

## 2021-05-21 ENCOUNTER — Other Ambulatory Visit: Payer: Self-pay

## 2021-05-21 DIAGNOSIS — D179 Benign lipomatous neoplasm, unspecified: Secondary | ICD-10-CM

## 2021-05-21 NOTE — Telephone Encounter (Signed)
See result note.  

## 2021-07-03 ENCOUNTER — Other Ambulatory Visit: Payer: Self-pay

## 2021-07-03 ENCOUNTER — Ambulatory Visit
Admission: RE | Admit: 2021-07-03 | Discharge: 2021-07-03 | Disposition: A | Discharge status: Home / Self Care | Payer: 59 | Source: Ambulatory Visit | Attending: Family Medicine | Admitting: Family Medicine

## 2021-07-03 DIAGNOSIS — Z1231 Encounter for screening mammogram for malignant neoplasm of breast: Secondary | ICD-10-CM

## 2021-07-08 ENCOUNTER — Other Ambulatory Visit: Payer: Self-pay | Admitting: Family Medicine

## 2021-07-08 DIAGNOSIS — R928 Other abnormal and inconclusive findings on diagnostic imaging of breast: Secondary | ICD-10-CM

## 2021-07-24 ENCOUNTER — Other Ambulatory Visit: Payer: Self-pay

## 2021-07-24 ENCOUNTER — Ambulatory Visit: Payer: 59

## 2021-07-24 ENCOUNTER — Ambulatory Visit
Admission: RE | Admit: 2021-07-24 | Discharge: 2021-07-24 | Disposition: A | Discharge status: Home / Self Care | Payer: 59 | Source: Ambulatory Visit | Attending: Family Medicine | Admitting: Family Medicine

## 2021-07-24 DIAGNOSIS — R928 Other abnormal and inconclusive findings on diagnostic imaging of breast: Secondary | ICD-10-CM

## 2021-08-14 ENCOUNTER — Other Ambulatory Visit: Payer: Self-pay | Admitting: General Surgery

## 2021-11-28 LAB — HM PAP SMEAR: HM Pap smear: NORMAL

## 2022-05-19 NOTE — Progress Notes (Unsigned)
HPI: Jessica Singh is a 46 y.o. female, who is here today for her routine physical.  Last CPE: 05/19/21  She exercises regularly, walks from 3-7 miles daily. In general she follows a healthful diet, she cooks. She does not eat many vegetables and eats some fruit, berries.She loves bread.She cooks most of the time.  Chronic medical problems: Tobacco use disorder and chronic fatigue.  Immunization History  Administered Date(s) Administered   Tdap 04/25/2018   Health Maintenance  Topic Date Due   COVID-19 Vaccine (1) 06/04/2022 (Originally 11/15/1976)   HIV Screening  04/29/2024 (Originally [birth year]/15/1993)   INFLUENZA VACCINE  06/09/2022   PAP SMEAR-Modifier  04/26/2023   TETANUS/TDAP  04/25/2028   Hepatitis C Screening  Completed   HPV VACCINES  Aged Out  Established with gyn about 6 months ago, had a pap smear done.  She would like labs done, including CBC. Her father with hx of lymphoma.  Chronic fatigue, stable. Sleeps about 6-7 hours.  Tobacco use, smokes about 15-20 cig/day. She is planning on quitting in 07/2022.  Review of Systems  Constitutional:  Positive for fatigue. Negative for activity change, appetite change and fever.  HENT:  Negative for hearing loss, mouth sores, sore throat and trouble swallowing.   Eyes:  Negative for redness and visual disturbance.  Respiratory:  Negative for cough, shortness of breath and wheezing.   Cardiovascular:  Negative for chest pain and leg swelling.  Gastrointestinal:  Negative for abdominal pain, nausea and vomiting.       No changes in bowel habits.  Endocrine: Negative for cold intolerance, heat intolerance, polydipsia, polyphagia and polyuria.  Genitourinary:  Negative for decreased urine volume, dysuria, hematuria, vaginal bleeding and vaginal discharge.  Musculoskeletal:  Negative for gait problem and myalgias.  Skin:  Negative for color change and rash.  Allergic/Immunologic: Negative for environmental allergies.   Neurological:  Negative for seizures, syncope, weakness and headaches.  Hematological:  Negative for adenopathy. Does not bruise/bleed easily.  Psychiatric/Behavioral:  Negative for behavioral problems and confusion.   All other systems reviewed and are negative.  No current outpatient medications on file prior to visit.   No current facility-administered medications on file prior to visit.   Past Medical History:  Diagnosis Date   Medical history non-contributory    Past Surgical History:  Procedure Laterality Date   ABLATION  2017   CESAREAN SECTION  07/16/96, 11/19/11, 12/24/12   No Known Allergies  Family History  Problem Relation Age of Onset   Cancer Father    Cancer Maternal Grandmother    Breast cancer Maternal Grandmother 85   Social History   Socioeconomic History   Marital status: Married    Spouse name: Not on file   Number of children: 4   Years of education: Not on file   Highest education level: Not on file  Occupational History   Not on file  Tobacco Use   Smoking status: Every Day    Types: Cigarettes   Smokeless tobacco: Never  Vaping Use   Vaping Use: Never used  Substance and Sexual Activity   Alcohol use: Not Currently   Drug use: Not Currently   Sexual activity: Yes  Other Topics Concern   Not on file  Social History Narrative   Not on file   Social Determinants of Health   Financial Resource Strain: Not on file  Food Insecurity: Not on file  Transportation Needs: Not on file  Physical Activity: Not on file  Stress:  Not on file  Social Connections: Not on file   Vitals:   05/20/22 0800  BP: 100/70  Pulse: 99  Resp: 12  Temp: 98.1 F (36.7 C)  SpO2: 99%   Body mass index is 19.93 kg/m. Wt Readings from Last 3 Encounters:  05/20/22 123 lb 8 oz (56 kg)  05/19/21 125 lb 8 oz (56.9 kg)  04/29/20 120 lb 3.2 oz (54.5 kg)   Physical Exam Vitals and nursing note reviewed.  Constitutional:      General: She is not in acute  distress.    Appearance: She is well-developed.  HENT:     Head: Normocephalic and atraumatic.     Right Ear: Hearing, tympanic membrane, ear canal and external ear normal.     Left Ear: Hearing, tympanic membrane, ear canal and external ear normal.     Mouth/Throat:     Mouth: Mucous membranes are moist.     Pharynx: Oropharynx is clear. Uvula midline.  Eyes:     Extraocular Movements: Extraocular movements intact.     Conjunctiva/sclera: Conjunctivae normal.     Pupils: Pupils are equal, round, and reactive to light.  Neck:     Thyroid: No thyromegaly.     Trachea: No tracheal deviation.  Cardiovascular:     Rate and Rhythm: Normal rate and regular rhythm.     Pulses:          Dorsalis pedis pulses are 2+ on the right side and 2+ on the left side.     Heart sounds: No murmur heard. Pulmonary:     Effort: Pulmonary effort is normal. No respiratory distress.     Breath sounds: Normal breath sounds.  Abdominal:     Palpations: Abdomen is soft. There is no hepatomegaly or mass.     Tenderness: There is no abdominal tenderness.  Genitourinary:    Comments: Deferred to gyn. Musculoskeletal:     Comments: No major deformity or signs of synovitis appreciated.  Lymphadenopathy:     Cervical: No cervical adenopathy.     Upper Body:     Right upper body: No supraclavicular adenopathy.     Left upper body: No supraclavicular adenopathy.  Skin:    General: Skin is warm.     Findings: No erythema or rash.  Neurological:     General: No focal deficit present.     Mental Status: She is alert and oriented to person, place, and time.     Cranial Nerves: No cranial nerve deficit.     Coordination: Coordination normal.     Gait: Gait normal.     Deep Tendon Reflexes:     Reflex Scores:      Bicep reflexes are 2+ on the right side and 2+ on the left side.      Patellar reflexes are 2+ on the right side and 2+ on the left side. Psychiatric:     Comments: Well groomed, good eye contact.    ASSESSMENT AND PLAN:  [name] was here today annual physical examination.  Orders Placed This Encounter  Procedures   CBC   Basic metabolic panel   Lipid panel   TSH   Lab Results  Component Value Date   TSH 1.88 05/20/2022   Lab Results  Component Value Date   WBC 6.4 05/20/2022   HGB 14.3 05/20/2022   HCT 42.1 05/20/2022   MCV 94.2 05/20/2022   PLT 223.0 05/20/2022   Lab Results  Component Value Date  CHOL 144 05/20/2022   HDL 63.50 05/20/2022   LDLCALC 68 05/20/2022   TRIG 65.0 05/20/2022   CHOLHDL 2 05/20/2022   Lab Results  Component Value Date   CREATININE 0.72 05/20/2022   BUN 13 05/20/2022   NA 137 05/20/2022   K 4.1 05/20/2022   CL 101 05/20/2022   CO2 29 05/20/2022   Routine general medical examination at a health care facility We discussed the importance of regular physical activity and healthy diet for prevention of chronic illness and/or complications. Preventive guidelines reviewed. Vaccination up to date. Continue her female preventive care with gyn. Next CPE in a year.  The 10-year ASCVD risk score (Arnett DK, et al., 2019) is: 0.7%   Values used to calculate the score:     Age: 51 years     Sex: Female     Is Non-Hispanic African American: No     Diabetic: No     Tobacco smoker: Yes     Systolic Blood Pressure: 563 mmHg     Is BP treated: No     HDL Cholesterol: 63.5 mg/dL     Total Cholesterol: 144 mg/dL  Screening for endocrine, metabolic and immunity disorder -     Basic metabolic panel  Screening for lipoid disorders -     Lipid panel  Tobacco use disorder We discussed benefits of smoking cessation as well as adverse effects of tobacco use. Reviewed some pharmacologic options, she would like to try Nicotine patches, will start with 21 mg daily x 28 days then will titrate down to 14 mg and 7 mg.   Chronic fatigue Stable. Further recommendations according to lab results.  Return in 1 year (on 05/21/2023)  for CPE.  Lakela Kuba G. Martin[place], MD  [facility]. [place] office.

## 2022-05-20 ENCOUNTER — Ambulatory Visit (INDEPENDENT_AMBULATORY_CARE_PROVIDER_SITE_OTHER): Payer: 59 | Admitting: Family Medicine

## 2022-05-20 ENCOUNTER — Encounter: Payer: Self-pay | Admitting: Family Medicine

## 2022-05-20 VITALS — BP 100/70 | HR 99 | Temp 98.1°F | Resp 12 | Ht 66.0 in | Wt 123.5 lb

## 2022-05-20 DIAGNOSIS — R5382 Chronic fatigue, unspecified: Secondary | ICD-10-CM

## 2022-05-20 DIAGNOSIS — Z1329 Encounter for screening for other suspected endocrine disorder: Secondary | ICD-10-CM

## 2022-05-20 DIAGNOSIS — Z13228 Encounter for screening for other metabolic disorders: Secondary | ICD-10-CM | POA: Diagnosis not present

## 2022-05-20 DIAGNOSIS — Z13220 Encounter for screening for lipoid disorders: Secondary | ICD-10-CM | POA: Diagnosis not present

## 2022-05-20 DIAGNOSIS — Z0000 Encounter for general adult medical examination without abnormal findings: Secondary | ICD-10-CM | POA: Diagnosis not present

## 2022-05-20 DIAGNOSIS — Z130 Encounter for screening for diseases of the blood and blood-forming organs and certain disorders involving the immune mechanism: Secondary | ICD-10-CM | POA: Diagnosis not present

## 2022-05-20 DIAGNOSIS — F17200 Nicotine dependence, unspecified, uncomplicated: Secondary | ICD-10-CM

## 2022-05-20 LAB — CBC
HCT: 42.1 % (ref 36.0–46.0)
Hemoglobin: 14.3 g/dL (ref 12.0–15.0)
MCHC: 33.9 g/dL (ref 30.0–36.0)
MCV: 94.2 fl (ref 78.0–100.0)
Platelets: 223 10*3/uL (ref 150.0–400.0)
RBC: 4.47 Mil/uL (ref 3.87–5.11)
RDW: 13.2 % (ref 11.5–15.5)
WBC: 6.4 10*3/uL (ref 4.0–10.5)

## 2022-05-20 LAB — BASIC METABOLIC PANEL
BUN: 13 mg/dL (ref 6–23)
CO2: 29 mEq/L (ref 19–32)
Calcium: 9.5 mg/dL (ref 8.4–10.5)
Chloride: 101 mEq/L (ref 96–112)
Creatinine, Ser: 0.72 mg/dL (ref 0.40–1.20)
GFR: 101.98 mL/min (ref >60.00)
Glucose, Bld: 85 mg/dL (ref 70–99)
Potassium: 4.1 mEq/L (ref 3.5–5.1)
Sodium: 137 mEq/L (ref 135–145)

## 2022-05-20 LAB — LIPID PANEL
Cholesterol: 144 mg/dL (ref 0–200)
HDL: 63.5 mg/dL (ref >39.00)
LDL Cholesterol: 68 mg/dL (ref 0–99)
NonHDL: 80.57
Total CHOL/HDL Ratio: 2
Triglycerides: 65 mg/dL (ref 0.0–149.0)
VLDL: 13 mg/dL (ref 0.0–40.0)

## 2022-05-20 LAB — TSH: TSH: 1.88 u[IU]/mL (ref 0.35–5.50)

## 2022-05-20 MED ORDER — NICOTINE 21 MG/24HR TD PT24: 21.0000 mg | MEDICATED_PATCH | Freq: Every day | TRANSDERMAL | 0 refills | Status: DC

## 2022-05-20 NOTE — Assessment & Plan Note (Signed)
We discussed benefits of smoking cessation as well as adverse effects of tobacco use. Reviewed some pharmacologic options, she would like to try Nicotine patches, will start with 21 mg daily x 28 days then will titrate down to 14 mg and 7 mg.

## 2022-05-20 NOTE — Patient Instructions (Addendum)
A few things to remember from today's visit:  Routine general medical examination at a health care facility  Screening for endocrine, metabolic and immunity disorder - Plan: Basic metabolic panel  Screening for lipoid disorders - Plan: Lipid panel  Chronic fatigue - Plan: CBC, TSH  Tobacco use disorder  Do not use My Chart to request refills or for acute issues that need immediate attention.  Please be sure medication list is accurate. If a new problem present, please set up appointment sooner than planned today.  Health Maintenance, Female Adopting a healthy lifestyle and getting preventive care are important in promoting health and wellness. Ask your health care provider about: The right schedule for you to have regular tests and exams. Things you can do on your own to prevent diseases and keep yourself healthy. What should I know about diet, weight, and exercise? Eat a healthy diet  Eat a diet that includes plenty of vegetables, fruits, low-fat dairy products, and lean protein. Do not eat a lot of foods that are high in solid fats, added sugars, or sodium. Maintain a healthy weight Body mass index (BMI) is used to identify weight problems. It estimates body fat based on height and weight. Your health care provider can help determine your BMI and help you achieve or maintain a healthy weight. Get regular exercise Get regular exercise. This is one of the most important things you can do for your health. Most adults should: Exercise for at least 150 minutes each week. The exercise should increase your heart rate and make you sweat (moderate-intensity exercise). Do strengthening exercises at least twice a week. This is in addition to the moderate-intensity exercise. Spend less time sitting. Even light physical activity can be beneficial. Watch cholesterol and blood lipids Have your blood tested for lipids and cholesterol at 44 years of age, then have this test every 5 years. Have  your cholesterol levels checked more often if: Your lipid or cholesterol levels are high. You are older than 44 years of age. You are at high risk for heart disease. What should I know about cancer screening? Depending on your health history and family history, you may need to have cancer screening at various ages. This may include screening for: Breast cancer. Cervical cancer. Colorectal cancer. Skin cancer. Lung cancer. What should I know about heart disease, diabetes, and high blood pressure? Blood pressure and heart disease High blood pressure causes heart disease and increases the risk of stroke. This is more likely to develop in people who have high blood pressure readings or are overweight. Have your blood pressure checked: Every 3-5 years if you are 60-18 years of age. Every year if you are 48 years old or older. Diabetes Have regular diabetes screenings. This checks your fasting blood sugar level. Have the screening done: Once every three years after age 89 if you are at a normal weight and have a low risk for diabetes. More often and at a younger age if you are overweight or have a high risk for diabetes. What should I know about preventing infection? Hepatitis B If you have a higher risk for hepatitis B, you should be screened for this virus. Talk with your health care provider to find out if you are at risk for hepatitis B infection. Hepatitis C Testing is recommended for: Everyone born from [birth year] through 1963. Anyone with known risk factors for hepatitis C. Sexually transmitted infections (STIs) Get screened for STIs, including gonorrhea and chlamydia, if: You are sexually active  and are younger than 44 years of age. You are older than 44 years of age and your health care provider tells you that you are at risk for this type of infection. Your sexual activity has changed since you were last screened, and you are at increased risk for chlamydia or gonorrhea. Ask your health  care provider if you are at risk. Ask your health care provider about whether you are at high risk for HIV. Your health care provider may recommend a prescription medicine to help prevent HIV infection. If you choose to take medicine to prevent HIV, you should first get tested for HIV. You should then be tested every 3 months for as long as you are taking the medicine. Pregnancy If you are about to stop having your period (premenopausal) and you may become pregnant, seek counseling before you get pregnant. Take 400 to 800 micrograms (mcg) of folic acid every day if you become pregnant. Ask for birth control (contraception) if you want to prevent pregnancy. Osteoporosis and menopause Osteoporosis is a disease in which the bones lose minerals and strength with aging. This can result in bone fractures. If you are 7 years old or older, or if you are at risk for osteoporosis and fractures, ask your health care provider if you should: Be screened for bone loss. Take a calcium or vitamin D supplement to lower your risk of fractures. Be given hormone replacement therapy (HRT) to treat symptoms of menopause. Follow these instructions at home: Alcohol use Do not drink alcohol if: Your health care provider tells you not to drink. You are pregnant, may be pregnant, or are planning to become pregnant. If you drink alcohol: Limit how much you have to: 0-1 drink a day. Know how much alcohol is in your drink. In the [place] one drink equals one 12 oz bottle of beer (355 mL), one 5 oz glass of wine (148 mL), or one 1 oz glass of hard liquor (44 mL). Lifestyle Do not use any products that contain nicotine or tobacco. These products include cigarettes, chewing tobacco, and vaping devices, such as e-cigarettes. If you need help quitting, ask your health care provider. Do not use street drugs. Do not share needles. Ask your health care provider for help if you need support or information about quitting  drugs. General instructions Schedule regular health, dental, and eye exams. Stay current with your vaccines. Tell your health care provider if: You often feel depressed. You have ever been abused or do not feel safe at home. Summary Adopting a healthy lifestyle and getting preventive care are important in promoting health and wellness. Follow your health care provider's instructions about healthy diet, exercising, and getting tested or screened for diseases. Follow your health care provider's instructions on monitoring your cholesterol and blood pressure. This information is not intended to replace advice given to you by your health care provider. Make sure you discuss any questions you have with your health care provider. Document Revised: 03/17/2021 Document Reviewed: 03/17/2021 Elsevier Patient Education  [place].

## 2022-05-20 NOTE — Assessment & Plan Note (Signed)
Stable. Further recommendations according to lab results.

## 2022-05-21 ENCOUNTER — Encounter: Payer: Self-pay | Admitting: Family Medicine

## 2022-06-02 ENCOUNTER — Other Ambulatory Visit: Payer: Self-pay | Admitting: Family Medicine

## 2022-06-02 DIAGNOSIS — Z1231 Encounter for screening mammogram for malignant neoplasm of breast: Secondary | ICD-10-CM

## 2022-07-07 IMAGING — MG DIGITAL SCREENING BILAT W/ TOMO W/ CAD
8 series · 9 of 24 positions shown · non-contrast
Comparison: Previous exam(s).

CLINICAL DATA: Screening.

EXAM:
DIGITAL SCREENING BILATERAL MAMMOGRAM WITH TOMO AND CAD

[R CC synth-2D]
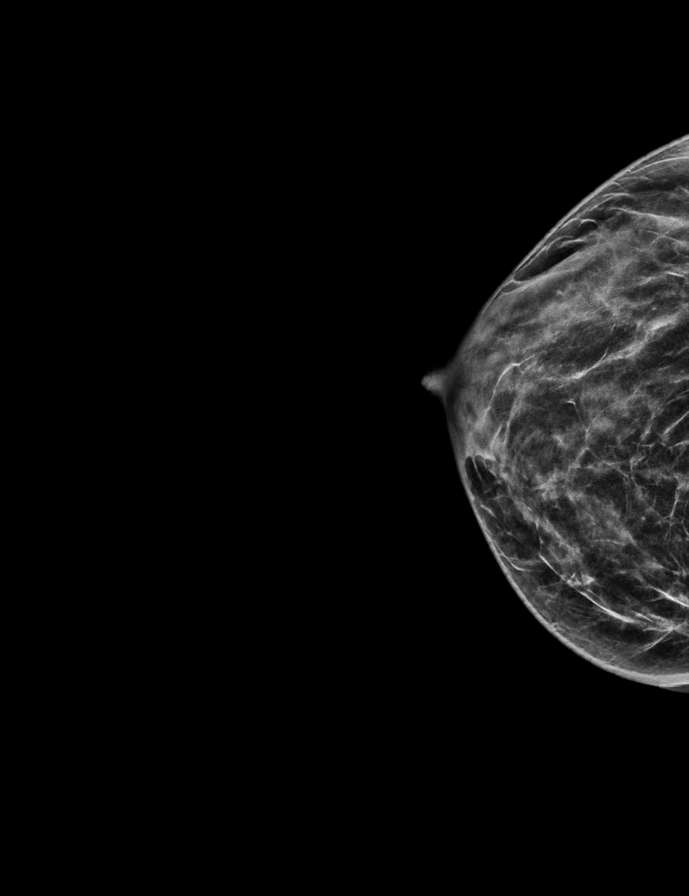

[R MLO synth-2D]
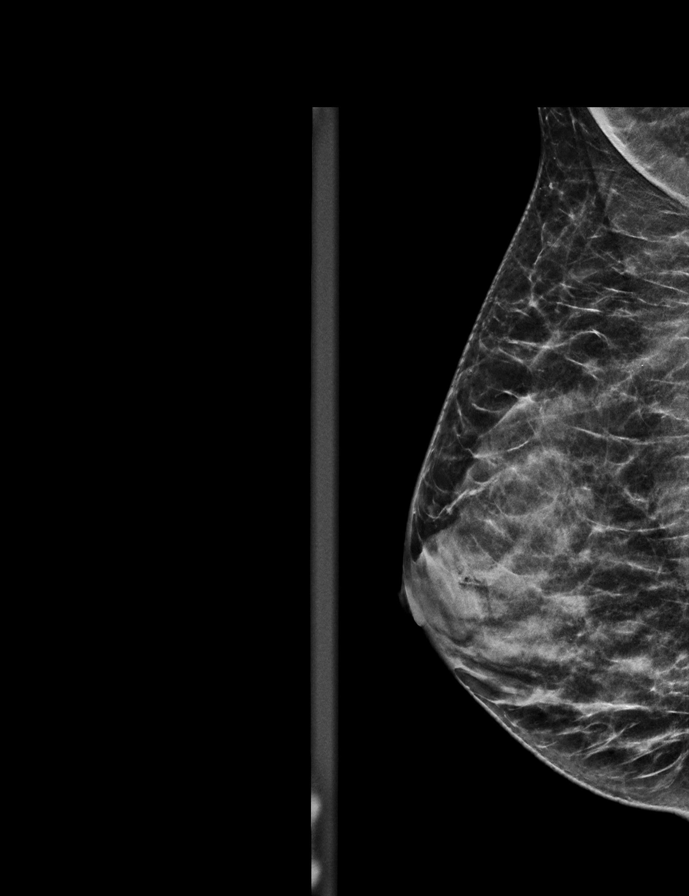

[L CC synth-2D]
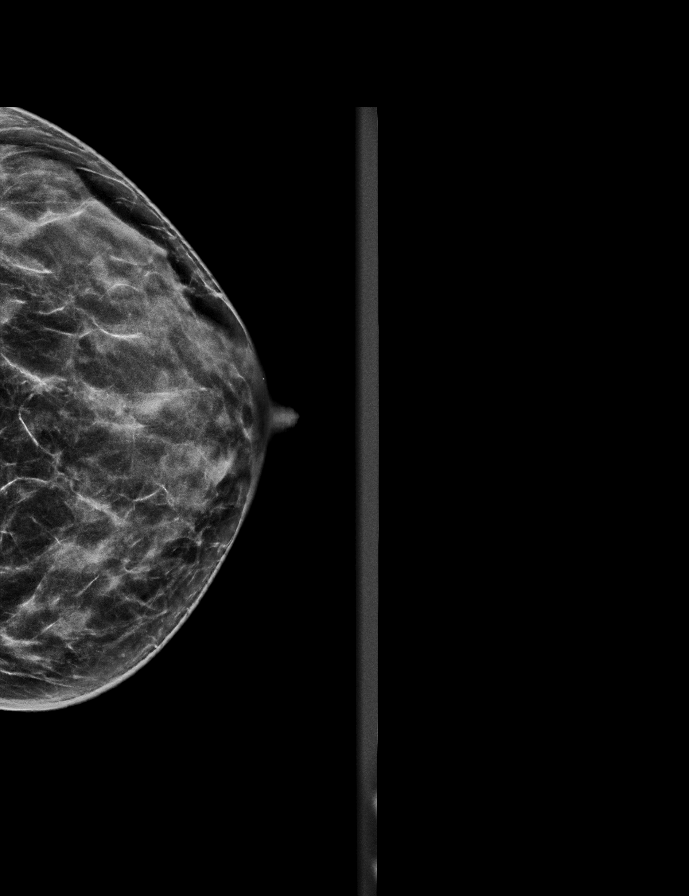

[L MLO synth-2D]
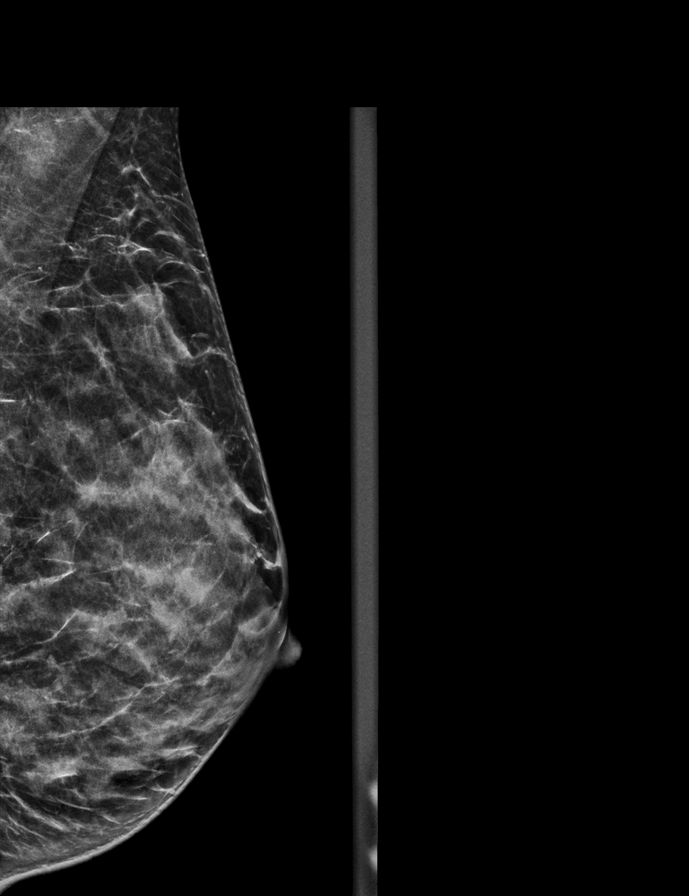

[R CC tomo · 2 of 48 frames shown]
[frame 16/48]
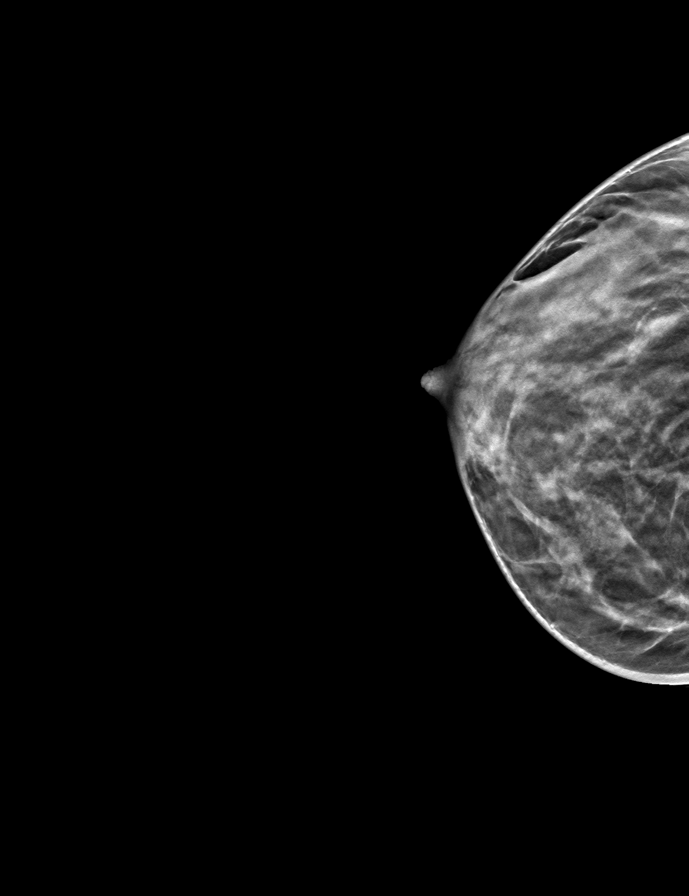
[frame 25/48]
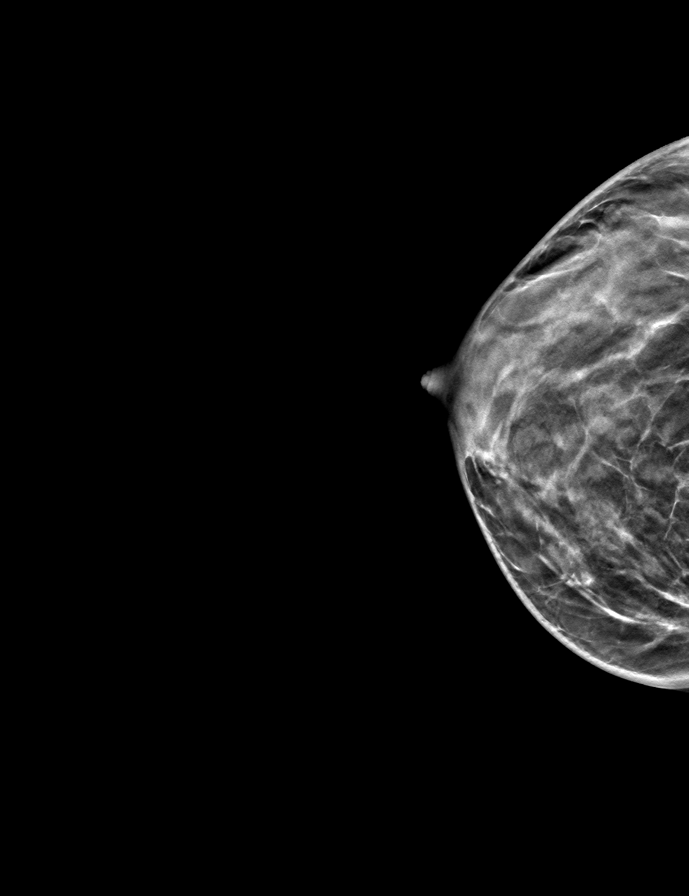

[L CC tomo · tomo slice 27/52.0]
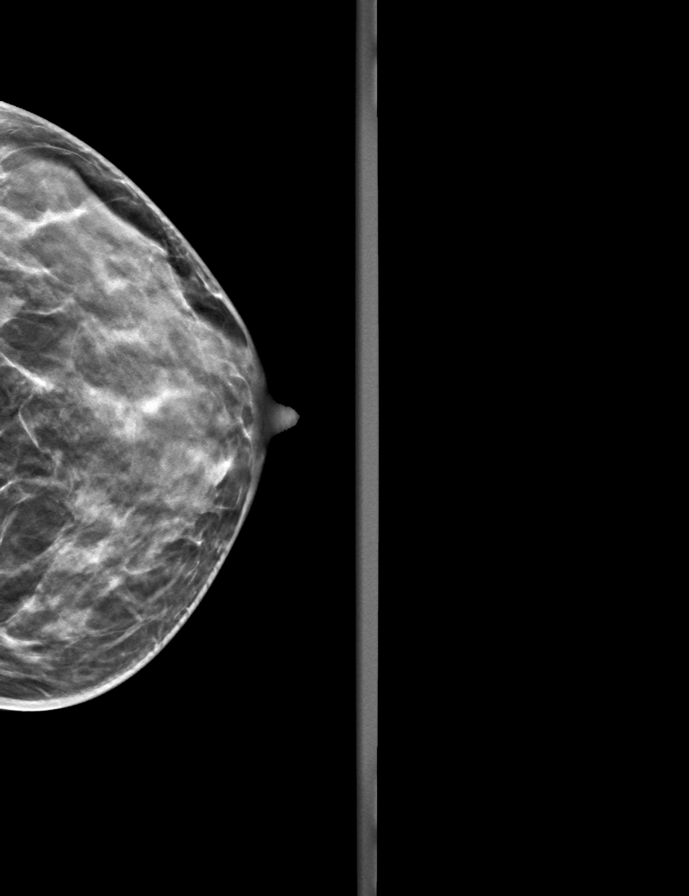

[L MLO tomo · tomo slice 24/47.0]
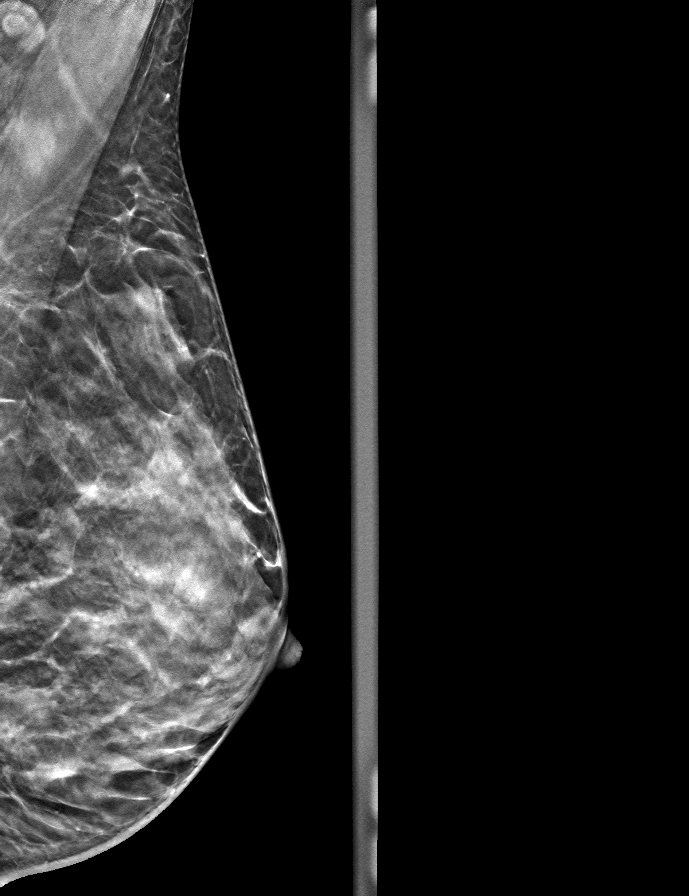

[R MLO tomo · tomo slice 23/45.0]
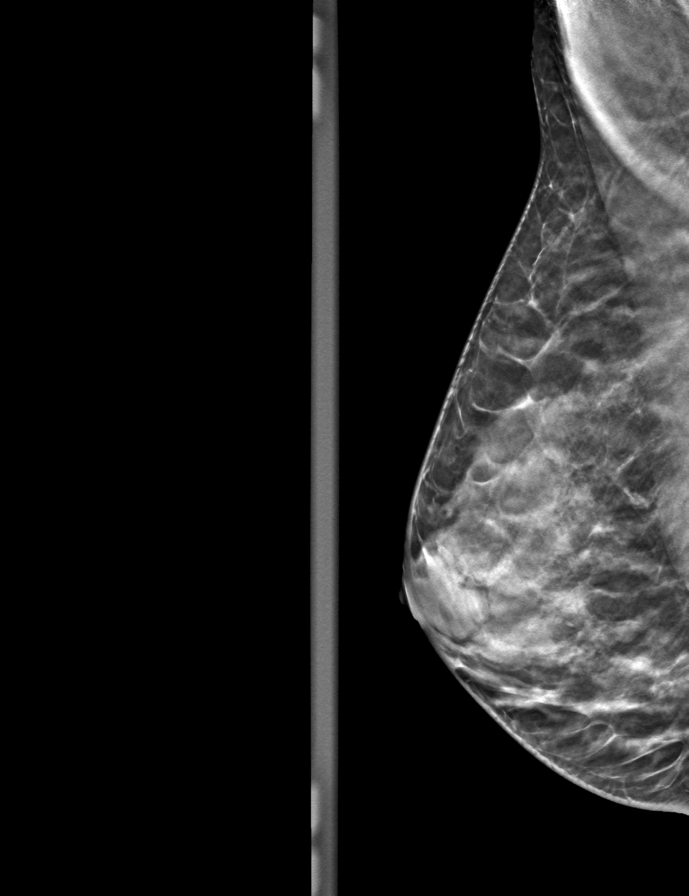

[9 of 24 positions shown; findings below may reference images not displayed]

ACR Breast Density Category c: The breast tissue is heterogeneously
dense, which may obscure small masses.
FINDINGS: There are no findings suspicious for malignancy. Images were
processed with CAD.
IMPRESSION: No mammographic evidence of malignancy. A result letter of this
screening mammogram will be mailed directly to the patient.

RECOMMENDATION:
Screening mammogram in one year. (Code:[id number])

BI-RADS CATEGORY  1: Negative.

## 2022-07-27 ENCOUNTER — Ambulatory Visit
Admission: RE | Admit: 2022-07-27 | Discharge: 2022-07-27 | Disposition: A | Discharge status: Home / Self Care | Payer: 59 | Source: Ambulatory Visit | Attending: Family Medicine | Admitting: Family Medicine

## 2022-07-27 DIAGNOSIS — Z1231 Encounter for screening mammogram for malignant neoplasm of breast: Secondary | ICD-10-CM

## 2023-07-19 ENCOUNTER — Other Ambulatory Visit: Payer: Self-pay | Admitting: Family Medicine

## 2023-07-19 DIAGNOSIS — Z1231 Encounter for screening mammogram for malignant neoplasm of breast: Secondary | ICD-10-CM

## 2023-08-09 ENCOUNTER — Encounter: Payer: Self-pay | Admitting: Family Medicine

## 2023-08-09 ENCOUNTER — Ambulatory Visit (INDEPENDENT_AMBULATORY_CARE_PROVIDER_SITE_OTHER): Payer: 59 | Admitting: Family Medicine

## 2023-08-09 VITALS — BP 110/70 | HR 88 | Temp 98.3°F | Resp 12 | Ht 66.0 in | Wt 129.2 lb

## 2023-08-09 DIAGNOSIS — Z0189 Encounter for other specified special examinations: Secondary | ICD-10-CM

## 2023-08-09 DIAGNOSIS — R5382 Chronic fatigue, unspecified: Secondary | ICD-10-CM | POA: Diagnosis not present

## 2023-08-09 DIAGNOSIS — Z13220 Encounter for screening for lipoid disorders: Secondary | ICD-10-CM | POA: Diagnosis not present

## 2023-08-09 DIAGNOSIS — Z13228 Encounter for screening for other metabolic disorders: Secondary | ICD-10-CM | POA: Diagnosis not present

## 2023-08-09 DIAGNOSIS — Z1211 Encounter for screening for malignant neoplasm of colon: Secondary | ICD-10-CM

## 2023-08-09 DIAGNOSIS — F17200 Nicotine dependence, unspecified, uncomplicated: Secondary | ICD-10-CM

## 2023-08-09 DIAGNOSIS — Z0000 Encounter for general adult medical examination without abnormal findings: Secondary | ICD-10-CM

## 2023-08-09 DIAGNOSIS — Z130 Encounter for screening for diseases of the blood and blood-forming organs and certain disorders involving the immune mechanism: Secondary | ICD-10-CM

## 2023-08-09 DIAGNOSIS — Z1329 Encounter for screening for other suspected endocrine disorder: Secondary | ICD-10-CM | POA: Diagnosis not present

## 2023-08-09 LAB — COMPREHENSIVE METABOLIC PANEL
ALT: 7 U/L (ref 0–35)
AST: 13 U/L (ref 0–37)
Albumin: 4.2 g/dL (ref 3.5–5.2)
Alkaline Phosphatase: 45 U/L (ref 39–117)
BUN: 9 mg/dL (ref 6–23)
CO2: 28 meq/L (ref 19–32)
Calcium: 9.3 mg/dL (ref 8.4–10.5)
Chloride: 103 meq/L (ref 96–112)
Creatinine, Ser: 0.64 mg/dL (ref 0.40–1.20)
GFR: 106.87 mL/min (ref >60.00)
Glucose, Bld: 87 mg/dL (ref 70–99)
Potassium: 3.8 meq/L (ref 3.5–5.1)
Sodium: 139 meq/L (ref 135–145)
Total Bilirubin: 0.6 mg/dL (ref 0.2–1.2)
Total Protein: 7 g/dL (ref 6.0–8.3)

## 2023-08-09 LAB — LIPID PANEL
Cholesterol: 146 mg/dL (ref 0–200)
HDL: 62.3 mg/dL (ref >39.00)
LDL Cholesterol: 71 mg/dL (ref 0–99)
NonHDL: 83.85
Total CHOL/HDL Ratio: 2
Triglycerides: 65 mg/dL (ref 0.0–149.0)
VLDL: 13 mg/dL (ref 0.0–40.0)

## 2023-08-09 LAB — CBC
HCT: 43.7 % (ref 36.0–46.0)
Hemoglobin: 14.4 g/dL (ref 12.0–15.0)
MCHC: 32.9 g/dL (ref 30.0–36.0)
MCV: 95.4 fL (ref 78.0–100.0)
Platelets: 243 10*3/uL (ref 150.0–400.0)
RBC: 4.58 Mil/uL (ref 3.87–5.11)
RDW: 13.4 % (ref 11.5–15.5)
WBC: 6.2 10*3/uL (ref 4.0–10.5)

## 2023-08-09 LAB — VITAMIN D 25 HYDROXY (VIT D DEFICIENCY, FRACTURES): VITD: 40.34 ng/mL (ref 30.00–100.00)

## 2023-08-09 LAB — TSH: TSH: 1.15 u[IU]/mL (ref 0.35–5.50)

## 2023-08-09 MED ORDER — VARENICLINE TARTRATE 1 MG PO TABS: 1.0000 mg | ORAL_TABLET | Freq: Two times a day (BID) | ORAL | 0 refills | Status: AC

## 2023-08-09 MED ORDER — VARENICLINE TARTRATE 0.5 MG PO TABS: ORAL_TABLET | ORAL | 0 refills | Status: AC

## 2023-08-09 NOTE — Assessment & Plan Note (Signed)
We discussed some pharmacologic options, nicotine gums and patches have not helped. She would like to try Chantix, prescription sent.

## 2023-08-09 NOTE — Assessment & Plan Note (Signed)
Stable for year. We discussed possible etiologies.  Blood work has been normal in the past, we could consider a sleep study, she would like to hold on this. Further recommendations according to lab results.

## 2023-08-09 NOTE — Patient Instructions (Addendum)
A few things to remember from today's visit:  Routine general medical examination at a health care facility  Colon cancer screening - Plan: Ambulatory referral to Gastroenterology  Screening for lipoid disorders - Plan: Lipid panel  Screening for endocrine, metabolic and immunity disorder - Plan: Comprehensive metabolic panel  Tobacco use disorder - Plan: varenicline (CHANTIX) 0.5 MG tablet, varenicline (CHANTIX) 1 MG tablet  Chronic fatigue - Plan: CBC, TSH  Do not use My Chart to request refills or for acute issues that need immediate attention. If you send a my chart message, it may take a few days to be addressed, specially if I am not in the office.  Please be sure medication list is accurate. If a new problem present, please set up appointment sooner than planned today.  Health Maintenance, Female Adopting a healthy lifestyle and getting preventive care are important in promoting health and wellness. Ask your health care provider about: The right schedule for you to have regular tests and exams. Things you can do on your own to prevent diseases and keep yourself healthy. What should I know about diet, weight, and exercise? Eat a healthy diet  Eat a diet that includes plenty of vegetables, fruits, low-fat dairy products, and lean protein. Do not eat a lot of foods that are high in solid fats, added sugars, or sodium. Maintain a healthy weight Body mass index (BMI) is used to identify weight problems. It estimates body fat based on height and weight. Your health care provider can help determine your BMI and help you achieve or maintain a healthy weight. Get regular exercise Get regular exercise. This is one of the most important things you can do for your health. Most adults should: Exercise for at least 150 minutes each week. The exercise should increase your heart rate and make you sweat (moderate-intensity exercise). Do strengthening exercises at least twice a week. This is  in addition to the moderate-intensity exercise. Spend less time sitting. Even light physical activity can be beneficial. Watch cholesterol and blood lipids Have your blood tested for lipids and cholesterol at 45 years of age, then have this test every 5 years. Have your cholesterol levels checked more often if: Your lipid or cholesterol levels are high. You are older than 45 years of age. You are at high risk for heart disease. What should I know about cancer screening? Depending on your health history and family history, you may need to have cancer screening at various ages. This may include screening for: Breast cancer. Cervical cancer. Colorectal cancer. Skin cancer. Lung cancer. What should I know about heart disease, diabetes, and high blood pressure? Blood pressure and heart disease High blood pressure causes heart disease and increases the risk of stroke. This is more likely to develop in people who have high blood pressure readings or are overweight. Have your blood pressure checked: Every 3-5 years if you are 32-19 years of age. Every year if you are 8 years old or older. Diabetes Have regular diabetes screenings. This checks your fasting blood sugar level. Have the screening done: Once every three years after age 60 if you are at a normal weight and have a low risk for diabetes. More often and at a younger age if you are overweight or have a high risk for diabetes. What should I know about preventing infection? Hepatitis B If you have a higher risk for hepatitis B, you should be screened for this virus. Talk with your health care provider to find out  if you are at risk for hepatitis B infection. Hepatitis C Testing is recommended for: Everyone born from [birth year] through 1963. Anyone with known risk factors for hepatitis C. Sexually transmitted infections (STIs) Get screened for STIs, including gonorrhea and chlamydia, if: You are sexually active and are younger than 45 years  of age. You are older than 45 years of age and your health care provider tells you that you are at risk for this type of infection. Your sexual activity has changed since you were last screened, and you are at increased risk for chlamydia or gonorrhea. Ask your health care provider if you are at risk. Ask your health care provider about whether you are at high risk for HIV. Your health care provider may recommend a prescription medicine to help prevent HIV infection. If you choose to take medicine to prevent HIV, you should first get tested for HIV. You should then be tested every 3 months for as long as you are taking the medicine. Pregnancy If you are about to stop having your period (premenopausal) and you may become pregnant, seek counseling before you get pregnant. Take 400 to 800 micrograms (mcg) of folic acid every day if you become pregnant. Ask for birth control (contraception) if you want to prevent pregnancy. Osteoporosis and menopause Osteoporosis is a disease in which the bones lose minerals and strength with aging. This can result in bone fractures. If you are 60 years old or older, or if you are at risk for osteoporosis and fractures, ask your health care provider if you should: Be screened for bone loss. Take a calcium or vitamin D supplement to lower your risk of fractures. Be given hormone replacement therapy (HRT) to treat symptoms of menopause. Follow these instructions at home: Alcohol use Do not drink alcohol if: Your health care provider tells you not to drink. You are pregnant, may be pregnant, or are planning to become pregnant. If you drink alcohol: Limit how much you have to: 0-1 drink a day. Know how much alcohol is in your drink. In the [place] one drink equals one 12 oz bottle of beer (355 mL), one 5 oz glass of wine (148 mL), or one 1 oz glass of hard liquor (44 mL). Lifestyle Do not use any products that contain nicotine or tobacco. These products include  cigarettes, chewing tobacco, and vaping devices, such as e-cigarettes. If you need help quitting, ask your health care provider. Do not use street drugs. Do not share needles. Ask your health care provider for help if you need support or information about quitting drugs. General instructions Schedule regular health, dental, and eye exams. Stay current with your vaccines. Tell your health care provider if: You often feel depressed. You have ever been abused or do not feel safe at home. Summary Adopting a healthy lifestyle and getting preventive care are important in promoting health and wellness. Follow your health care provider's instructions about healthy diet, exercising, and getting tested or screened for diseases. Follow your health care provider's instructions on monitoring your cholesterol and blood pressure. This information is not intended to replace advice given to you by your health care provider. Make sure you discuss any questions you have with your health care provider. Document Revised: 03/17/2021 Document Reviewed: 03/17/2021 Elsevier Patient Education  2024 [employer].

## 2023-08-09 NOTE — Assessment & Plan Note (Signed)
We discussed the importance of regular physical activity and healthy diet for prevention of chronic illness and/or complications. Preventive guidelines reviewed. Vaccination up-to-date, declined flu vaccine. Continue her female preventive care with gynecologist. GI referral placed to discuss colonoscopy for colon cancer screening. Mammogram scheduled for next week. Next CPE in a year.

## 2023-08-09 NOTE — Progress Notes (Signed)
HPI: Jessica Singh is a 47 y.o. female with a PMHx significant for tobacco use disorder and chronic fatigue, who is here today for her routine physical.  Last CPE: 05/20/2022  She has established with gynecology, follows annually. Mammogram scheduled for next week.  Exercise: She walks for about an hour every other day.  Diet: She is cooking at home. She doesn't eat vegetables daily. She eats mostly chicken. She snacks on popcorn or chips.  Sleep: She sleeps 7-8 hours per night.  Alcohol Use: She doesn't drink. Smoking: She is still smoking 1 pack per day. She has tried nicotine patches and gum before, she would like to try pharmacologic treatment, has not tried Chantix.  Immunization History  Administered Date(s) Administered   Tdap 04/25/2018   Health Maintenance  Topic Date Due   Colonoscopy  Never done   COVID-19 Vaccine (1 - 2023-24 season) 08/25/2023 (Originally 07/11/2023)   INFLUENZA VACCINE  02/07/2024 (Originally 06/10/2023)   HIV Screening  04/29/2024 (Originally 05/15/1993)   Cervical Cancer Screening (HPV/Pap Cotest)  11/28/2024   DTaP/Tdap/Td (2 - Td or Tdap) 04/25/2028   Hepatitis C Screening  Completed   HPV VACCINES  Aged Out   Chronic fatigue:  She reports feeling fatigued recently. She denies associated mood issues, depression, or hopelessness. She does not feel rested when getting up in the morning. Requesting CBC done. No known OSA. Lab Results  Component Value Date   TSH 1.88 05/20/2022   Lab Results  Component Value Date   WBC 6.4 05/20/2022   HGB 14.3 05/20/2022   HCT 42.1 05/20/2022   MCV 94.2 05/20/2022   PLT 223.0 05/20/2022   Lab Results  Component Value Date   NA 137 05/20/2022   CL 101 05/20/2022   K 4.1 05/20/2022   CO2 29 05/20/2022   BUN 13 05/20/2022   CREATININE 0.72 05/20/2022   GFR 101.98 05/20/2022   CALCIUM 9.5 05/20/2022   ALBUMIN 4.5 04/29/2020   GLUCOSE 85 05/20/2022   Lab Results  Component Value Date   CHOL  144 05/20/2022   HDL 63.50 05/20/2022   LDLCALC 68 05/20/2022   TRIG 65.0 05/20/2022   CHOLHDL 2 05/20/2022   She has no new concerns today.   Review of Systems  Constitutional:  Positive for fatigue. Negative for activity change, appetite change, chills and fever.  HENT:  Negative for hearing loss, mouth sores, sore throat and trouble swallowing.   Eyes:  Negative for redness and visual disturbance.  Respiratory:  Negative for cough, shortness of breath and wheezing.   Cardiovascular:  Negative for chest pain and leg swelling.  Gastrointestinal:  Negative for abdominal pain, nausea and vomiting.       No changes in bowel habits.  Endocrine: Negative for cold intolerance, heat intolerance, polydipsia, polyphagia and polyuria.  Genitourinary:  Negative for decreased urine volume, dysuria, hematuria, vaginal bleeding and vaginal discharge.  Musculoskeletal:  Negative for gait problem and myalgias.  Skin:  Negative for color change and rash.  Allergic/Immunologic: Negative for environmental allergies.  Neurological:  Negative for syncope, weakness and headaches.  Hematological:  Negative for adenopathy. Does not bruise/bleed easily.  Psychiatric/Behavioral:  Negative for behavioral problems, confusion and hallucinations.   All other systems reviewed and are negative.  No current outpatient medications on file prior to visit.   No current facility-administered medications on file prior to visit.   Past Medical History:  Diagnosis Date   Medical history non-contributory    Past Surgical History:  Procedure Laterality Date   ABLATION  2017   CESAREAN SECTION  07/16/96, 11/19/11, 12/24/12   No Known Allergies  Family History  Problem Relation Age of Onset   Cancer Father    Cancer Maternal Grandmother    Breast cancer Maternal Grandmother 94   Social History   Socioeconomic History   Marital status: Married    Spouse name: Not on file   Number of children: 4   Years of  education: Not on file   Highest education level: Not on file  Occupational History   Not on file  Tobacco Use   Smoking status: Every Day    Types: Cigarettes   Smokeless tobacco: Never  Vaping Use   Vaping status: Never Used  Substance and Sexual Activity   Alcohol use: Not Currently   Drug use: Not Currently   Sexual activity: Yes  Other Topics Concern   Not on file  Social History Narrative   Not on file   Social Determinants of Health   Financial Resource Strain: Not on file  Food Insecurity: Not on file  Transportation Needs: Not on file  Physical Activity: Not on file  Stress: Not on file  Social Connections: Not on file   Vitals:   08/09/23 0842  BP: 110/70  Pulse: 88  Resp: 12  Temp: 98.3 F (36.8 C)  SpO2: 98%   Body mass index is 20.86 kg/m.  Wt Readings from Last 3 Encounters:  08/09/23 129 lb 4 oz (58.6 kg)  05/20/22 123 lb 8 oz (56 kg)  05/19/21 125 lb 8 oz (56.9 kg)   Physical Exam Vitals and nursing note reviewed.  Constitutional:      General: She is not in acute distress.    Appearance: She is well-developed.  HENT:     Head: Normocephalic and atraumatic.     Right Ear: Tympanic membrane, ear canal and external ear normal.     Left Ear: Tympanic membrane, ear canal and external ear normal.     Mouth/Throat:     Mouth: Mucous membranes are moist.     Pharynx: Oropharynx is clear. Uvula midline.  Eyes:     Extraocular Movements: Extraocular movements intact.     Conjunctiva/sclera: Conjunctivae normal.     Pupils: Pupils are equal, round, and reactive to light.  Neck:     Thyroid: No thyroid mass or thyromegaly.  Cardiovascular:     Rate and Rhythm: Normal rate and regular rhythm.     Pulses:          Dorsalis pedis pulses are 2+ on the right side and 2+ on the left side.     Heart sounds: No murmur heard. Pulmonary:     Effort: Pulmonary effort is normal. No respiratory distress.     Breath sounds: Normal breath sounds.   Abdominal:     Palpations: Abdomen is soft. There is no hepatomegaly or mass.     Tenderness: There is no abdominal tenderness.  Genitourinary:    Comments: Deferred to gyn. Musculoskeletal:        General: No swelling, tenderness or deformity.     Right lower leg: No edema.     Left lower leg: No edema.  Lymphadenopathy:     Cervical: No cervical adenopathy.     Upper Body:     Right upper body: No supraclavicular adenopathy.     Left upper body: No supraclavicular adenopathy.  Skin:    General: Skin is warm.  Findings: No erythema or rash.  Neurological:     General: No focal deficit present.     Mental Status: She is alert and oriented to person, place, and time.     Cranial Nerves: No cranial nerve deficit.     Coordination: Coordination normal.     Gait: Gait normal.     Deep Tendon Reflexes:     Reflex Scores:      Bicep reflexes are 2+ on the right side and 2+ on the left side.      Patellar reflexes are 2+ on the right side and 2+ on the left side. Psychiatric:        Mood and Affect: Mood and affect normal.    ASSESSMENT AND PLAN:  Jessica Singh was here today for her annual physical examination.  Orders Placed This Encounter  Procedures   HM PAP SMEAR   Comprehensive metabolic panel   CBC   TSH   Lipid panel   VITAMIN D 25 Hydroxy (Vit-D Deficiency, Fractures)   Ambulatory referral to Gastroenterology   Lab Results  Component Value Date   NA 139 08/09/2023   CL 103 08/09/2023   K 3.8 08/09/2023   CO2 28 08/09/2023   BUN 9 08/09/2023   CREATININE 0.64 08/09/2023   GFR 106.87 08/09/2023   CALCIUM 9.3 08/09/2023   ALBUMIN 4.2 08/09/2023   GLUCOSE 87 08/09/2023   Lab Results  Component Value Date   ALT 7 08/09/2023   AST 13 08/09/2023   ALKPHOS 45 08/09/2023   BILITOT 0.6 08/09/2023   Lab Results  Component Value Date   WBC 6.2 08/09/2023   HGB 14.4 08/09/2023   HCT 43.7 08/09/2023   MCV 95.4 08/09/2023   PLT 243.0 08/09/2023    Lab Results  Component Value Date   TSH 1.15 08/09/2023   Lab Results  Component Value Date   CHOL 146 08/09/2023   HDL 62.30 08/09/2023   LDLCALC 71 08/09/2023   TRIG 65.0 08/09/2023   CHOLHDL 2 08/09/2023   Routine general medical examination at a health care facility Assessment & Plan: We discussed the importance of regular physical activity and healthy diet for prevention of chronic illness and/or complications. Preventive guidelines reviewed. Vaccination up-to-date, declined flu vaccine. Continue her female preventive care with gynecologist. GI referral placed to discuss colonoscopy for colon cancer screening. Mammogram scheduled for next week. Next CPE in a year.   Colon cancer screening -     Ambulatory referral to Gastroenterology  Screening for lipoid disorders -     Lipid panel; Future  Screening for endocrine, metabolic and immunity disorder -     Comprehensive metabolic panel; Future  Tobacco use disorder Assessment & Plan: We discussed some pharmacologic options, nicotine gums and patches have not helped. She would like to try Chantix, prescription sent.  Orders: -     Varenicline Tartrate; 1 tab daily x 3d then bid x 4 d then go to 1 mg tab.  Dispense: 11 tablet; Refill: 0 -     Varenicline Tartrate; Take 1 tablet (1 mg total) by mouth 2 (two) times daily.  Dispense: 140 tablet; Refill: 0  Chronic fatigue Assessment & Plan: Stable for year. We discussed possible etiologies.  Blood work has been normal in the past, we could consider a sleep study, she would like to hold on this. Further recommendations according to lab results.  Orders: -     CBC; Future -     TSH; Future  Patient request for diagnostic testing -     VITAMIN D 25 Hydroxy (Vit-D Deficiency, Fractures); Future   Return in 1 year (on 08/08/2024) for CPE.  I, Rolla Etienne Wierda, acting as a scribe for Kerrianne Jeng [place], MD., have documented all relevant documentation on the behalf of  Giorgio Chabot [place], MD, as directed by  Mikai Meints [place], MD while in the presence of Jabree Rebert [place], MD.   I, Adaja Wander [place], MD, have reviewed all documentation for this visit. The documentation on 08/09/23 for the exam, diagnosis, procedures, and orders are all accurate and complete.  Vang Kraeger G. [place], MD  [facility]. [place] office.

## 2023-08-12 ENCOUNTER — Ambulatory Visit
Admission: RE | Admit: 2023-08-12 | Discharge: 2023-08-12 | Disposition: A | Discharge status: Home / Self Care | Payer: 59 | Source: Ambulatory Visit | Attending: Family Medicine | Admitting: Family Medicine

## 2023-08-12 DIAGNOSIS — Z1231 Encounter for screening mammogram for malignant neoplasm of breast: Secondary | ICD-10-CM

## 2023-09-09 ENCOUNTER — Encounter: Payer: Self-pay | Admitting: Internal Medicine

## 2023-10-05 ENCOUNTER — Ambulatory Visit (AMBULATORY_SURGERY_CENTER): Payer: 59

## 2023-10-05 VITALS — Ht 65.0 in | Wt 128.0 lb

## 2023-10-05 DIAGNOSIS — Z1211 Encounter for screening for malignant neoplasm of colon: Secondary | ICD-10-CM

## 2023-10-05 MED ORDER — NA SULFATE-K SULFATE-MG SULF 17.5-3.13-1.6 GM/177ML PO SOLN: 1.0000 | Freq: Once | ORAL | 0 refills | Status: AC

## 2023-10-05 MED ORDER — ONDANSETRON HCL 4 MG PO TABS: 4.0000 mg | ORAL_TABLET | Freq: Three times a day (TID) | ORAL | 1 refills | Status: AC | PRN

## 2023-10-05 NOTE — Progress Notes (Signed)

## 2023-10-18 ENCOUNTER — Encounter: Payer: Self-pay | Admitting: Internal Medicine

## 2023-10-27 ENCOUNTER — Encounter: Payer: Self-pay | Admitting: Internal Medicine

## 2023-10-27 ENCOUNTER — Ambulatory Visit: Payer: 59 | Admitting: Internal Medicine

## 2023-10-27 VITALS — BP 92/54 | HR 87 | Temp 98.8°F | Resp 14 | Ht 65.0 in | Wt 128.0 lb

## 2023-10-27 DIAGNOSIS — D123 Benign neoplasm of transverse colon: Secondary | ICD-10-CM

## 2023-10-27 DIAGNOSIS — Z1211 Encounter for screening for malignant neoplasm of colon: Secondary | ICD-10-CM | POA: Diagnosis present

## 2023-10-27 DIAGNOSIS — K648 Other hemorrhoids: Secondary | ICD-10-CM | POA: Diagnosis not present

## 2023-10-27 DIAGNOSIS — K635 Polyp of colon: Secondary | ICD-10-CM

## 2023-10-27 MED ORDER — SODIUM CHLORIDE 0.9 % IV SOLN: 500.0000 mL | Freq: Once | INTRAVENOUS | Status: DC

## 2023-10-27 NOTE — Progress Notes (Signed)
Called to room to assist during endoscopic procedure.  Patient ID and intended procedure confirmed with present staff. Received instructions for my participation in the procedure from the performing physician.  

## 2023-10-27 NOTE — Op Note (Signed)
[facility] Patient Name: Jessica Singh Procedure Date: 10/27/2023 9:58 AM MRN: [id number] Endoscopist: Madelyn Brunner [place] , , [id number] Age: 47 Referring MD:  Date of Birth: May 02, 1976 Gender: Female Account #: [id number] Procedure:                Colonoscopy Indications:              Screening for colorectal malignant neoplasm, This                            is the patient's first colonoscopy Medicines:                Monitored Anesthesia Care Procedure:                Pre-Anesthesia Assessment:                           - Prior to the procedure, a History and Physical                            was performed, and patient medications and                            allergies were reviewed. The patient's tolerance of                            previous anesthesia was also reviewed. The risks                            and benefits of the procedure and the sedation                            options and risks were discussed with the patient.                            All questions were answered, and informed consent                            was obtained. Prior Anticoagulants: The patient has                            taken no anticoagulant or antiplatelet agents. ASA                            Grade Assessment: II - A patient with mild systemic                            disease. After reviewing the risks and benefits,                            the patient was deemed in satisfactory condition to                            undergo the procedure.  After obtaining informed consent, the colonoscope                            was passed under direct vision. Throughout the                            procedure, the patient's blood pressure, pulse, and                            oxygen saturations were monitored continuously. The                            Olympus Scope SN: [id number] was introduced through                            the anus and  advanced to the the terminal ileum.                            The colonoscopy was performed without difficulty.                            The patient tolerated the procedure well. The                            quality of the bowel preparation was excellent. The                            terminal ileum, ileocecal valve, appendiceal                            orifice, and rectum were photographed. Scope In: 10:16:01 AM Scope Out: 10:33:01 AM Scope Withdrawal Time: 0 hours 10 minutes 41 seconds  Total Procedure Duration: 0 hours 17 minutes 0 seconds  Findings:                 The terminal ileum appeared normal.                           Two sessile polyps were found in the transverse                            colon. The polyps were 3 to 7 mm in size. These                            polyps were removed with a cold snare. Resection                            and retrieval were complete.                           Non-bleeding internal hemorrhoids were found during                            retroflexion. Complications:  No immediate complications. Estimated Blood Loss:     Estimated blood loss was minimal. Impression:               - The examined portion of the ileum was normal.                           - Two 3 to 7 mm polyps in the transverse colon,                            removed with a cold snare. Resected and retrieved.                           - Non-bleeding internal hemorrhoids. Recommendation:           - Discharge patient to home (with escort).                           - Await pathology results.                           - The findings and recommendations were discussed                            with the patient. Dr Particia Lather "Alan Ripper" Leonides Schanz,  10/27/2023 10:39:36 AM

## 2023-10-27 NOTE — Patient Instructions (Signed)
Resume all of your previous medications today.  Read all of your discharge instructions.  YOU HAD AN ENDOSCOPIC PROCEDURE TODAY AT THE [facility]:   Refer to the procedure report that was given to you for any specific questions about what was found during the examination.  If the procedure report does not answer your questions, please call your gastroenterologist to clarify.  If you requested that your care partner not be given the details of your procedure findings, then the procedure report has been included in a sealed envelope for you to review at your convenience later.  YOU SHOULD EXPECT: Some feelings of bloating in the abdomen. Passage of more gas than usual.  Walking can help get rid of the air that was put into your GI tract during the procedure and reduce the bloating. If you had a lower endoscopy (such as a colonoscopy or flexible sigmoidoscopy) you may notice spotting of blood in your stool or on the toilet paper. If you underwent a bowel prep for your procedure, you may not have a normal bowel movement for a few days.  Please Note:  You might notice some irritation and congestion in your nose or some drainage.  This is from the oxygen used during your procedure.  There is no need for concern and it should clear up in a day or so.  SYMPTOMS TO REPORT IMMEDIATELY:  Following lower endoscopy (colonoscopy or flexible sigmoidoscopy):  Excessive amounts of blood in the stool  Significant tenderness or worsening of abdominal pains  Swelling of the abdomen that is new, acute  Fever of 100F or higher  For urgent or emergent issues, a gastroenterologist can be reached at any hour by calling [phone]. Do not use MyChart messaging for urgent concerns.    DIET:  We do recommend a small meal at first, but then you may proceed to your regular diet.  Drink plenty of fluids but you should avoid alcoholic beverages for 24 hours. Try to increase the fiber in your diet,and eat  plenty of fiber.  ACTIVITY:  You should plan to take it easy for the rest of today and you should NOT DRIVE or use heavy machinery until tomorrow (because of the sedation medicines used during the test).    FOLLOW UP: Our staff will call the number listed on your records the next business day following your procedure.  We will call around 7:15- 8:00 am to check on you and address any questions or concerns that you may have regarding the information given to you following your procedure. If we do not reach you, we will leave a message.     If any biopsies were taken you will be contacted by phone or by letter within the next 1-3 weeks.  Please call us at [phone] if you have not heard about the biopsies in 3 weeks.    SIGNATURES/CONFIDENTIALITY: You and/or your care partner have signed paperwork which will be entered into your electronic medical record.  These signatures attest to the fact that that the information above on your After Visit Summary has been reviewed and is understood.  Full responsibility of the confidentiality of this discharge information lies with you and/or your care-partner.

## 2023-10-27 NOTE — Progress Notes (Signed)
Sedate, gd SR, tolerated procedure well, VSS, report to RN 

## 2023-10-27 NOTE — Progress Notes (Signed)
Pt's states no medical or surgical changes since previsit or office visit. 

## 2023-10-27 NOTE — Progress Notes (Signed)
GASTROENTEROLOGY PROCEDURE H&P NOTE   Primary Care Physician: [place], Betty G, MD    Reason for Procedure:   Colon cancer screening  Plan:    Colonoscopy   Patient is appropriate for endoscopic procedure(s) in the ambulatory ([facility]) setting.  The nature of the procedure, as well as the risks, benefits, and alternatives were carefully and thoroughly reviewed with the patient. Ample time for discussion and questions allowed. The patient understood, was satisfied, and agreed to proceed.     HPI: Jessica Singh is a 47 y.o. female who presents for colonoscopy for colon cancer screening. Denies blood in stools, changes in bowel habits, or unintentional weight loss. Denies family history of colon cancer.  Past Medical History:  Diagnosis Date   Medical history non-contributory     Past Surgical History:  Procedure Laterality Date   ABLATION  2017   CESAREAN SECTION  07/16/96, 11/19/11, 12/24/12    Prior to Admission medications   Medication Sig Start Date End Date Taking? Authorizing Provider  ondansetron (ZOFRAN) 4 MG tablet Take 1 tablet (4 mg total) by mouth every 8 (eight) hours as needed for nausea or vomiting. 10/05/23  Yes Imogene Burn, MD  RETIN-A 0.05 % cream SMARTSIG:1 Application Topical Every Evening 09/21/23  Yes [provider]    Current Outpatient Medications  Medication Sig Dispense Refill   ondansetron (ZOFRAN) 4 MG tablet Take 1 tablet (4 mg total) by mouth every 8 (eight) hours as needed for nausea or vomiting. 30 tablet 1   RETIN-A 0.05 % cream SMARTSIG:1 Application Topical Every Evening     Current Facility-Administered Medications  Medication Dose Route Frequency Provider Last Rate Last Admin   0.9 %  sodium chloride infusion  500 mL Intravenous Once Imogene Burn, MD        Allergies as of 10/27/2023   (No Known Allergies)    Family History  Problem Relation Age of Onset   Cancer Father    Breast cancer Maternal Aunt    Cancer  Maternal Grandmother    Breast cancer Maternal Grandmother 3   Colon cancer Neg Hx    Esophageal cancer Neg Hx    Rectal cancer Neg Hx    Stomach cancer Neg Hx     Social History   Socioeconomic History   Marital status: Married    Spouse name: Not on file   Number of children: 4   Years of education: Not on file   Highest education level: Not on file  Occupational History   Not on file  Tobacco Use   Smoking status: Every Day    Current packs/day: 0.50    Average packs/day: 0.5 packs/day for 26.0 years (13.0 ttl pk-yrs)    Types: Cigarettes    Start date: 1999   Smokeless tobacco: Never  Vaping Use   Vaping status: Never Used  Substance and Sexual Activity   Alcohol use: Not Currently   Drug use: Not Currently   Sexual activity: Yes  Other Topics Concern   Not on file  Social History Narrative   Not on file   Social Drivers of Health   Financial Resource Strain: Not on file  Food Insecurity: Not on file  Transportation Needs: Not on file  Physical Activity: Not on file  Stress: Not on file  Social Connections: Not on file  Intimate Partner Violence: Not on file    Physical Exam: Vital signs in last 24 hours: BP 108/60   Pulse 81  Temp 98.8 F (37.1 C)   Ht 5\' 5"  (1.651 m)   Wt 128 lb (58.1 kg)   SpO2 99%   BMI 21.30 kg/m  GEN: NAD EYE: Sclerae anicteric ENT: MMM CV: Non-tachycardic Pulm: No increased work of breathing GI: Soft, NT/ND NEURO:  Alert & Oriented   Eulah Pont, MD [facility]  10/27/2023 10:02 AM

## 2023-10-28 ENCOUNTER — Telehealth: Payer: Self-pay

## 2023-10-28 NOTE — Telephone Encounter (Signed)
  Follow up Call-     10/27/2023    9:59 AM  Call back number  Post procedure Call Back phone  # [phone]  Permission to leave phone message Yes     Patient questions:  Do you have a fever, pain , or abdominal swelling? No. Pain Score  0 *  Have you tolerated food without any problems? Yes.    Have you been able to return to your normal activities? Yes.    Do you have any questions about your discharge instructions: Diet   No. Medications  No. Follow up visit  No.  Do you have questions or concerns about your Care? No.  Actions: * If pain score is 4 or above: No action needed, pain <4.

## 2023-10-29 ENCOUNTER — Encounter: Payer: Self-pay | Admitting: Internal Medicine

## 2023-10-29 LAB — SURGICAL PATHOLOGY

## 2024-07-17 ENCOUNTER — Other Ambulatory Visit: Payer: Self-pay | Admitting: Family Medicine

## 2024-07-17 DIAGNOSIS — Z1231 Encounter for screening mammogram for malignant neoplasm of breast: Secondary | ICD-10-CM

## 2024-08-09 ENCOUNTER — Encounter: Payer: Self-pay | Admitting: Family Medicine

## 2024-08-09 ENCOUNTER — Ambulatory Visit: Payer: 59 | Admitting: Family Medicine

## 2024-08-09 VITALS — BP 118/68 | HR 88 | Temp 97.8°F | Resp 16 | Ht 66.0 in | Wt 126.6 lb

## 2024-08-09 DIAGNOSIS — Z130 Encounter for screening for diseases of the blood and blood-forming organs and certain disorders involving the immune mechanism: Secondary | ICD-10-CM

## 2024-08-09 DIAGNOSIS — Z13228 Encounter for screening for other metabolic disorders: Secondary | ICD-10-CM | POA: Diagnosis not present

## 2024-08-09 DIAGNOSIS — L739 Follicular disorder, unspecified: Secondary | ICD-10-CM

## 2024-08-09 DIAGNOSIS — Z13220 Encounter for screening for lipoid disorders: Secondary | ICD-10-CM

## 2024-08-09 DIAGNOSIS — Z0189 Encounter for other specified special examinations: Secondary | ICD-10-CM

## 2024-08-09 DIAGNOSIS — Z0000 Encounter for general adult medical examination without abnormal findings: Secondary | ICD-10-CM | POA: Diagnosis not present

## 2024-08-09 DIAGNOSIS — Z131 Encounter for screening for diabetes mellitus: Secondary | ICD-10-CM

## 2024-08-09 LAB — LIPID PANEL
Cholesterol: 149 mg/dL (ref 0–200)
HDL: 63.4 mg/dL (ref >39.00)
LDL Cholesterol: 72 mg/dL (ref 0–99)
NonHDL: 85.25
Total CHOL/HDL Ratio: 2
Triglycerides: 64 mg/dL (ref 0.0–149.0)
VLDL: 12.8 mg/dL (ref 0.0–40.0)

## 2024-08-09 LAB — CBC
HCT: 43.1 % (ref 36.0–46.0)
Hemoglobin: 14.6 g/dL (ref 12.0–15.0)
MCHC: 33.9 g/dL (ref 30.0–36.0)
MCV: 94.3 fl (ref 78.0–100.0)
Platelets: 217 K/uL (ref 150.0–400.0)
RBC: 4.57 Mil/uL (ref 3.87–5.11)
RDW: 13.3 % (ref 11.5–15.5)
WBC: 5.3 K/uL (ref 4.0–10.5)

## 2024-08-09 LAB — BASIC METABOLIC PANEL WITH GFR
BUN: 13 mg/dL (ref 6–23)
CO2: 28 meq/L (ref 19–32)
Calcium: 9.4 mg/dL (ref 8.4–10.5)
Chloride: 102 meq/L (ref 96–112)
Creatinine, Ser: 0.62 mg/dL (ref 0.40–1.20)
GFR: 106.93 mL/min (ref >60.00)
Glucose, Bld: 82 mg/dL (ref 70–99)
Potassium: 4.2 meq/L (ref 3.5–5.1)
Sodium: 137 meq/L (ref 135–145)

## 2024-08-09 LAB — HEMOGLOBIN A1C: Hgb A1c MFr Bld: 5.8 % (ref 4.6–6.5)

## 2024-08-09 MED ORDER — MUPIROCIN 2 % EX OINT: 1.0000 | TOPICAL_OINTMENT | Freq: Two times a day (BID) | CUTANEOUS | 0 refills | Status: AC

## 2024-08-09 NOTE — Assessment & Plan Note (Signed)
 We discussed the importance of regular physical activity and healthy diet for prevention of chronic illness and/or complications. Preventive guidelines reviewed. Vaccination up today, she declines flu vaccine. Continue her female preventive care with her gynecologist. Encouraged smoking cessation, she is planning on trying nicotine  patches.  She did not tolerate Chantix . Next CPE in a year.

## 2024-08-09 NOTE — Patient Instructions (Addendum)
 A few things to remember from today's visit:  Routine general medical examination at a health care facility  Screening for lipoid disorders - Plan: Lipid panel  Screening for endocrine, metabolic and immunity disorder - Plan: Basic metabolic panel with GFR, Hemoglobin A1c  Patient request for diagnostic testing - Plan: CBC  Folliculitis - Plan: mupirocin ointment (BACTROBAN) 2 %  To prevent ingrown hairs, you can apply Cortizone, small amount after shaving. Use antibiotic oint 2 times daily for 10 days.  Do not use My Chart to request refills or for acute issues that need immediate attention. If you send a my chart message, it may take a few days to be addressed, specially if I am not in the office.  Please be sure medication list is accurate. If a new problem present, please set up appointment sooner than planned today.  Health Maintenance, Female Adopting a healthy lifestyle and getting preventive care are important in promoting health and wellness. Ask your health care provider about: The right schedule for you to have regular tests and exams. Things you can do on your own to prevent diseases and keep yourself healthy. What should I know about diet, weight, and exercise? Eat a healthy diet  Eat a diet that includes plenty of vegetables, fruits, low-fat dairy products, and lean protein. Do not eat a lot of foods that are high in solid fats, added sugars, or sodium. Maintain a healthy weight Body mass index (BMI) is used to identify weight problems. It estimates body fat based on height and weight. Your health care provider can help determine your BMI and help you achieve or maintain a healthy weight. Get regular exercise Get regular exercise. This is one of the most important things you can do for your health. Most adults should: Exercise for at least 150 minutes each week. The exercise should increase your heart rate and make you sweat (moderate-intensity exercise). Do  strengthening exercises at least twice a week. This is in addition to the moderate-intensity exercise. Spend less time sitting. Even light physical activity can be beneficial. Watch cholesterol and blood lipids Have your blood tested for lipids and cholesterol at 46 years of age, then have this test every 5 years. Have your cholesterol levels checked more often if: Your lipid or cholesterol levels are high. You are older than 46 years of age. You are at high risk for heart disease. What should I know about cancer screening? Depending on your health history and family history, you may need to have cancer screening at various ages. This may include screening for: Breast cancer. Cervical cancer. Colorectal cancer. Skin cancer. Lung cancer. What should I know about heart disease, diabetes, and high blood pressure? Blood pressure and heart disease High blood pressure causes heart disease and increases the risk of stroke. This is more likely to develop in people who have high blood pressure readings or are overweight. Have your blood pressure checked: Every 3-5 years if you are 45-37 years of age. Every year if you are 67 years old or older. Diabetes Have regular diabetes screenings. This checks your fasting blood sugar level. Have the screening done: Once every three years after age 63 if you are at a normal weight and have a low risk for diabetes. More often and at a younger age if you are overweight or have a high risk for diabetes. What should I know about preventing infection? Hepatitis B If you have a higher risk for hepatitis B, you should be screened for  this virus. Talk with your health care provider to find out if you are at risk for hepatitis B infection. Hepatitis C Testing is recommended for: Everyone born from [birth year] through 1963. Anyone with known risk factors for hepatitis C. Sexually transmitted infections (STIs) Get screened for STIs, including gonorrhea and chlamydia,  if: You are sexually active and are younger than 46 years of age. You are older than 46 years of age and your health care provider tells you that you are at risk for this type of infection. Your sexual activity has changed since you were last screened, and you are at increased risk for chlamydia or gonorrhea. Ask your health care provider if you are at risk. Ask your health care provider about whether you are at high risk for HIV. Your health care provider may recommend a prescription medicine to help prevent HIV infection. If you choose to take medicine to prevent HIV, you should first get tested for HIV. You should then be tested every 3 months for as long as you are taking the medicine. Pregnancy If you are about to stop having your period (premenopausal) and you may become pregnant, seek counseling before you get pregnant. Take 400 to 800 micrograms (mcg) of folic acid every day if you become pregnant. Ask for birth control (contraception) if you want to prevent pregnancy. Osteoporosis and menopause Osteoporosis is a disease in which the bones lose minerals and strength with aging. This can result in bone fractures. If you are 76 years old or older, or if you are at risk for osteoporosis and fractures, ask your health care provider if you should: Be screened for bone loss. Take a calcium or vitamin D  supplement to lower your risk of fractures. Be given hormone replacement therapy (HRT) to treat symptoms of menopause. Follow these instructions at home: Alcohol use Do not drink alcohol if: Your health care provider tells you not to drink. You are pregnant, may be pregnant, or are planning to become pregnant. If you drink alcohol: Limit how much you have to: 0-1 drink a day. Know how much alcohol is in your drink. In the [place] one drink equals one 12 oz bottle of beer (355 mL), one 5 oz glass of wine (148 mL), or one 1 oz glass of hard liquor (44 mL). Lifestyle Do not use any products that  contain nicotine  or tobacco. These products include cigarettes, chewing tobacco, and vaping devices, such as e-cigarettes. If you need help quitting, ask your health care provider. Do not use street drugs. Do not share needles. Ask your health care provider for help if you need support or information about quitting drugs. General instructions Schedule regular health, dental, and eye exams. Stay current with your vaccines. Tell your health care provider if: You often feel depressed. You have ever been abused or do not feel safe at home. Summary Adopting a healthy lifestyle and getting preventive care are important in promoting health and wellness. Follow your health care provider's instructions about healthy diet, exercising, and getting tested or screened for diseases. Follow your health care provider's instructions on monitoring your cholesterol and blood pressure. This information is not intended to replace advice given to you by your health care provider. Make sure you discuss any questions you have with your health care provider. Document Revised: 03/17/2021 Document Reviewed: 03/17/2021 Elsevier Patient Education  2024 [employer].

## 2024-08-09 NOTE — Progress Notes (Unsigned)
 Chief Complaint  Patient presents with   Annual Exam   HPI: Jessica Singh is a 48 y.o. female with a PMHx significant for tobacco use disorder and chronic fatigue, who is here today for her routine physical.  Last CPE: 08/09/23  She sees with gynecology regularly. She exercises regularly, walks about 60 minutes daily. She is cooking at home most of the time but also eats out a few times per week, she is trying to eat more vegetables. Sleeps about 7 hours. She does not drink alcohol. Is smoking about 15 cigarettes/day.  She tried Chantix  last year, it caused nausea and insomnia.  She is planning on nicotine  patches.  Immunization History  Administered Date(s) Administered   Tdap 04/25/2018   Health Maintenance  Topic Date Due   COVID-19 Vaccine (1 - 2024-25 season) 08/25/2024 (Originally 07/10/2024)   Influenza Vaccine  02/06/2025 (Originally 06/09/2024)   Hepatitis B Vaccines 19-59 Average Risk (1 of 3 - 19+ 3-dose series) 08/09/2025 (Originally 05/15/1997)   HIV Screening  08/09/2025 (Originally 05/15/1993)   Cervical Cancer Screening (HPV/Pap Cotest)  11/28/2024   Mammogram  08/11/2025   DTaP/Tdap/Td (2 - Td or Tdap) 04/25/2028   Colonoscopy  10/26/2028   Hepatitis C Screening  Completed   HPV VACCINES  Aged Out   Meningococcal B Vaccine  Aged Out   Pneumococcal Vaccine  Discontinued   No history of hyperlipidemia, hypertension, or diabetes. She would like lipid panel done today as well as CBC.  Lab Results  Component Value Date   TSH 1.15 08/09/2023   Lab Results  Component Value Date   WBC 6.2 08/09/2023   HGB 14.4 08/09/2023   HCT 43.7 08/09/2023   MCV 95.4 08/09/2023   PLT 243.0 08/09/2023   Lab Results  Component Value Date   NA 139 08/09/2023   CL 103 08/09/2023   K 3.8 08/09/2023   CO2 28 08/09/2023   BUN 9 08/09/2023   CREATININE 0.64 08/09/2023   GFR 106.87 08/09/2023   CALCIUM 9.3 08/09/2023   ALBUMIN 4.2 08/09/2023   GLUCOSE 87  08/09/2023   Lab Results  Component Value Date   CHOL 146 08/09/2023   HDL 62.30 08/09/2023   LDLCALC 71 08/09/2023   TRIG 65.0 08/09/2023   CHOLHDL 2 08/09/2023   She mentions left groin soreness for the past few days, which she attributes to an ingrown hair that she was trying to take out with a needle. Negative for fever, chills, abnormal weight loss, or night sweats. She has not used OTC medication.  Review of Systems  Constitutional:  Positive for fatigue. Negative for activity change, appetite change, chills and fever.  HENT:  Negative for mouth sores, sore throat and trouble swallowing.   Eyes:  Negative for redness and visual disturbance.  Respiratory:  Negative for cough, shortness of breath and wheezing.   Cardiovascular:  Negative for chest pain and leg swelling.  Gastrointestinal:  Negative for abdominal pain, nausea and vomiting.  Endocrine: Negative for cold intolerance, heat intolerance, polydipsia, polyphagia and polyuria.  Genitourinary:  Negative for decreased urine volume, dysuria and hematuria.  Musculoskeletal:  Negative for gait problem and myalgias.  Skin:  Negative for color change and rash.  Allergic/Immunologic: Negative for environmental allergies.  Neurological:  Negative for syncope, weakness and headaches.  Hematological:  Does not bruise/bleed easily.  Psychiatric/Behavioral:  Negative for confusion and hallucinations.   All other systems reviewed and are negative.  Current Outpatient Medications on File Prior to  Visit  Medication Sig Dispense Refill   RETIN-A 0.05 % cream SMARTSIG:1 Application Topical Every Evening     ondansetron  (ZOFRAN ) 4 MG tablet Take 1 tablet (4 mg total) by mouth every 8 (eight) hours as needed for nausea or vomiting. (Patient not taking: Reported on 08/09/2024) 30 tablet 1   No current facility-administered medications on file prior to visit.   Past Medical History:  Diagnosis Date   Medical history non-contributory     Past Surgical History:  Procedure Laterality Date   ABLATION  2017   CESAREAN SECTION  07/16/96, 11/19/11, 12/24/12   No Known Allergies  Family History  Problem Relation Age of Onset   Cancer Father    Breast cancer Maternal Aunt    Cancer Maternal Grandmother    Breast cancer Maternal Grandmother 73   Colon cancer Neg Hx    Esophageal cancer Neg Hx    Rectal cancer Neg Hx    Stomach cancer Neg Hx    Social History   Socioeconomic History   Marital status: Married    Spouse name: Not on file   Number of children: 4   Years of education: Not on file   Highest education level: Not on file  Occupational History   Not on file  Tobacco Use   Smoking status: Every Day    Current packs/day: 0.50    Average packs/day: 0.5 packs/day for 26.7 years (13.4 ttl pk-yrs)    Types: Cigarettes    Start date: 1999   Smokeless tobacco: Never  Vaping Use   Vaping status: Never Used  Substance and Sexual Activity   Alcohol use: Not Currently   Drug use: Not Currently   Sexual activity: Yes  Other Topics Concern   Not on file  Social History Narrative   Not on file   Social Drivers of Health   Financial Resource Strain: Not on file  Food Insecurity: Not on file  Transportation Needs: Not on file  Physical Activity: Not on file  Stress: Not on file  Social Connections: Not on file   Vitals:   08/09/24 0854  BP: 118/68  Pulse: 88  Resp: 16  Temp: 97.8 F (36.6 C)  SpO2: 99%   Body mass index is 20.43 kg/m.  Wt Readings from Last 3 Encounters:  08/09/24 126 lb 9.6 oz (57.4 kg)  10/27/23 128 lb (58.1 kg)  10/05/23 128 lb (58.1 kg)   Physical Exam Vitals and nursing note reviewed.  Constitutional:      General: She is not in acute distress.    Appearance: She is well-developed.  HENT:     Head: Normocephalic and atraumatic.     Right Ear: Tympanic membrane, ear canal and external ear normal.     Left Ear: Tympanic membrane, ear canal and external ear normal.      Mouth/Throat:     Mouth: Mucous membranes are moist.     Pharynx: Oropharynx is clear. Uvula midline.  Eyes:     Extraocular Movements: Extraocular movements intact.     Conjunctiva/sclera: Conjunctivae normal.     Pupils: Pupils are equal, round, and reactive to light.  Neck:     Thyroid : No thyroid  mass or thyromegaly.  Cardiovascular:     Rate and Rhythm: Normal rate and regular rhythm.     Pulses:          Dorsalis pedis pulses are 2+ on the right side and 2+ on the left side.     Heart sounds:  No murmur heard. Pulmonary:     Effort: Pulmonary effort is normal. No respiratory distress.     Breath sounds: Normal breath sounds.  Abdominal:     Palpations: Abdomen is soft. There is no hepatomegaly or mass.     Tenderness: There is no abdominal tenderness.  Genitourinary:    Comments: Deferred to gyn. Musculoskeletal:        General: No swelling, tenderness or deformity.     Right lower leg: No edema.     Left lower leg: No edema.  Lymphadenopathy:     Cervical: No cervical adenopathy.     Upper Body:     Right upper body: No supraclavicular adenopathy.     Left upper body: No supraclavicular adenopathy.     Lower Body: No left inguinal (Tender around area, not enlarged.) adenopathy.  Skin:    General: Skin is warm.     Findings: No erythema or rash.     Comments: Pubic area, left-sided, with 2-3 erythematous papular lesions.One of lesions mildly indurated and tender. No fluctuant area.  Neurological:     General: No focal deficit present.     Mental Status: She is alert and oriented to person, place, and time.     Cranial Nerves: No cranial nerve deficit.     Motor: No tremor.     Gait: Gait normal.     Deep Tendon Reflexes:     Reflex Scores:      Bicep reflexes are 2+ on the right side and 2+ on the left side.      Patellar reflexes are 2+ on the right side and 2+ on the left side. Psychiatric:        Mood and Affect: Mood and affect normal.   ASSESSMENT AND  PLAN:  Ms. Teresina Bugaj was here today for her annual physical examination.  Orders Placed This Encounter  Procedures   CBC   Basic metabolic panel with GFR   Lipid panel   Hemoglobin A1c   Lab Results  Component Value Date   WBC 5.3 08/09/2024   HGB 14.6 08/09/2024   HCT 43.1 08/09/2024   MCV 94.3 08/09/2024   PLT 217.0 08/09/2024   Lab Results  Component Value Date   HGBA1C 5.8 08/09/2024   Lab Results  Component Value Date   NA 137 08/09/2024   CL 102 08/09/2024   K 4.2 08/09/2024   CO2 28 08/09/2024   BUN 13 08/09/2024   CREATININE 0.62 08/09/2024   GFR 106.93 08/09/2024   CALCIUM 9.4 08/09/2024   ALBUMIN 4.2 08/09/2023   GLUCOSE 82 08/09/2024   Lab Results  Component Value Date   CHOL 149 08/09/2024   HDL 63.40 08/09/2024   LDLCALC 72 08/09/2024   TRIG 64.0 08/09/2024   CHOLHDL 2 08/09/2024   Routine general medical examination at a health care facility Assessment & Plan: We discussed the importance of regular physical activity and healthy diet for prevention of chronic illness and/or complications. Preventive guidelines reviewed. Vaccination up today, she declines flu vaccine. Continue her female preventive care with her gynecologist. Encouraged smoking cessation, she is planning on trying nicotine  patches.  She did not tolerate Chantix . Next CPE in a year.  Screening for lipoid disorders -     Lipid panel; Future  Screening for endocrine, metabolic and immunity disorder -     Basic metabolic panel with GFR; Future -     Hemoglobin A1c; Future  Patient request for diagnostic testing -  CBC; Future  Folliculitis Mild.  Recommend topical antibiotic ointment twice daily for 10 days. Monitor for changes in left groin, if masses or persistent soreness, she needs to follow here in the office or with her gynecologist. For prevention she can try applying OTC cortizone after shaving.  -     Mupirocin; Apply 1 Application topically 2 (two)  times daily for 10 days.  Dispense: 30 g; Refill: 0  Return in 1 year (on 08/09/2025) for CPE.  Tameria Patti G. [place], MD  [facility]. [place] office.

## 2024-08-10 ENCOUNTER — Ambulatory Visit: Payer: Self-pay | Admitting: Family Medicine

## 2024-08-14 ENCOUNTER — Ambulatory Visit
Admission: RE | Admit: 2024-08-14 | Discharge: 2024-08-14 | Disposition: A | Discharge status: Home / Self Care | Source: Ambulatory Visit | Attending: Family Medicine | Admitting: Family Medicine

## 2024-08-14 DIAGNOSIS — Z1231 Encounter for screening mammogram for malignant neoplasm of breast: Secondary | ICD-10-CM

## 2025-08-10 ENCOUNTER — Encounter: Admitting: Family Medicine
